# Patient Record
Sex: Female | Born: 1965 | Race: White | Hispanic: No | Marital: Married | Smoking: Former smoker
Health system: Southern US, Community
[De-identification: ages and names within clinical notes are randomized; demographics above are authoritative.]

## PROBLEM LIST (undated history)

## (undated) DIAGNOSIS — G43009 Migraine without aura, not intractable, without status migrainosus: Secondary | ICD-10-CM

## (undated) DIAGNOSIS — R740 Nonspecific elevation of levels of transaminase and lactic acid dehydrogenase [LDH]: Secondary | ICD-10-CM

## (undated) DIAGNOSIS — K219 Gastro-esophageal reflux disease without esophagitis: Secondary | ICD-10-CM

## (undated) DIAGNOSIS — M51369 Other intervertebral disc degeneration, lumbar region without mention of lumbar back pain or lower extremity pain: Secondary | ICD-10-CM

## (undated) DIAGNOSIS — E781 Pure hyperglyceridemia: Secondary | ICD-10-CM

## (undated) DIAGNOSIS — IMO0002 Reserved for concepts with insufficient information to code with codable children: Secondary | ICD-10-CM

## (undated) DIAGNOSIS — F419 Anxiety disorder, unspecified: Secondary | ICD-10-CM

## (undated) DIAGNOSIS — M5136 Other intervertebral disc degeneration, lumbar region: Secondary | ICD-10-CM

## (undated) HISTORY — DX: Nonspecific elevation of levels of transaminase and lactic acid dehydrogenase (ldh): R74.0

## (undated) HISTORY — PX: OTHER SURGICAL HISTORY: SHX169

## (undated) HISTORY — DX: Anxiety disorder, unspecified: F41.9

## (undated) HISTORY — DX: Other intervertebral disc degeneration, lumbar region: M51.36

## (undated) HISTORY — DX: Migraine without aura, not intractable, without status migrainosus: G43.009

## (undated) HISTORY — DX: Reserved for concepts with insufficient information to code with codable children: IMO0002

## (undated) HISTORY — DX: Pure hyperglyceridemia: E78.1

## (undated) HISTORY — DX: Other intervertebral disc degeneration, lumbar region without mention of lumbar back pain or lower extremity pain: M51.369

## (undated) HISTORY — DX: Gastro-esophageal reflux disease without esophagitis: K21.9

## (undated) HISTORY — PX: TONSILLECTOMY: SUR1361

---

## 1998-01-15 ENCOUNTER — Emergency Department (HOSPITAL_COMMUNITY): Admission: EM | Admit: 1998-01-15 | Discharge: 1998-01-15 | Payer: Self-pay | Admitting: Internal Medicine

## 1998-02-14 ENCOUNTER — Other Ambulatory Visit: Admission: RE | Admit: 1998-02-14 | Discharge: 1998-02-14 | Payer: Self-pay | Admitting: Obstetrics and Gynecology

## 1999-04-18 ENCOUNTER — Other Ambulatory Visit: Admission: RE | Admit: 1999-04-18 | Discharge: 1999-04-18 | Payer: Self-pay | Admitting: *Deleted

## 2000-06-30 ENCOUNTER — Encounter: Payer: Self-pay | Admitting: Internal Medicine

## 2000-06-30 ENCOUNTER — Ambulatory Visit (HOSPITAL_COMMUNITY): Admission: RE | Admit: 2000-06-30 | Discharge: 2000-06-30 | Payer: Self-pay | Admitting: Internal Medicine

## 2000-08-26 ENCOUNTER — Emergency Department (HOSPITAL_COMMUNITY): Admission: EM | Admit: 2000-08-26 | Discharge: 2000-08-27 | Payer: Self-pay | Admitting: Internal Medicine

## 2000-08-28 ENCOUNTER — Ambulatory Visit (HOSPITAL_COMMUNITY): Admission: RE | Admit: 2000-08-28 | Discharge: 2000-08-28 | Payer: Self-pay | Admitting: Emergency Medicine

## 2000-08-28 ENCOUNTER — Encounter: Payer: Self-pay | Admitting: Emergency Medicine

## 2001-04-28 ENCOUNTER — Other Ambulatory Visit: Admission: RE | Admit: 2001-04-28 | Discharge: 2001-04-28 | Payer: Self-pay | Admitting: Obstetrics and Gynecology

## 2001-05-19 ENCOUNTER — Other Ambulatory Visit: Admission: RE | Admit: 2001-05-19 | Discharge: 2001-05-19 | Payer: Self-pay | Admitting: Obstetrics and Gynecology

## 2001-12-10 ENCOUNTER — Encounter: Admission: RE | Admit: 2001-12-10 | Discharge: 2001-12-10 | Payer: Self-pay | Admitting: Obstetrics and Gynecology

## 2001-12-10 ENCOUNTER — Encounter: Payer: Self-pay | Admitting: Obstetrics and Gynecology

## 2002-05-02 ENCOUNTER — Other Ambulatory Visit: Admission: RE | Admit: 2002-05-02 | Discharge: 2002-05-02 | Payer: Self-pay | Admitting: Obstetrics and Gynecology

## 2002-06-03 ENCOUNTER — Encounter: Admission: RE | Admit: 2002-06-03 | Discharge: 2002-06-03 | Payer: Self-pay | Admitting: Obstetrics and Gynecology

## 2002-06-03 ENCOUNTER — Encounter: Payer: Self-pay | Admitting: Obstetrics and Gynecology

## 2003-11-29 ENCOUNTER — Encounter: Admission: RE | Admit: 2003-11-29 | Discharge: 2003-11-29 | Payer: Self-pay | Admitting: Family Medicine

## 2004-12-10 ENCOUNTER — Ambulatory Visit: Payer: Self-pay | Admitting: Internal Medicine

## 2005-01-30 ENCOUNTER — Ambulatory Visit: Payer: Self-pay | Admitting: Internal Medicine

## 2005-10-10 ENCOUNTER — Ambulatory Visit: Payer: Self-pay | Admitting: Internal Medicine

## 2006-02-18 ENCOUNTER — Ambulatory Visit: Payer: Self-pay | Admitting: Internal Medicine

## 2006-04-17 ENCOUNTER — Ambulatory Visit: Payer: Self-pay | Admitting: Internal Medicine

## 2006-04-23 ENCOUNTER — Encounter: Admission: RE | Admit: 2006-04-23 | Discharge: 2006-04-23 | Payer: Self-pay | Admitting: Internal Medicine

## 2006-09-10 ENCOUNTER — Encounter: Admission: RE | Admit: 2006-09-10 | Discharge: 2006-09-10 | Payer: Self-pay | Admitting: Obstetrics and Gynecology

## 2007-04-16 ENCOUNTER — Telehealth (INDEPENDENT_AMBULATORY_CARE_PROVIDER_SITE_OTHER): Payer: Self-pay | Admitting: *Deleted

## 2007-05-14 ENCOUNTER — Telehealth (INDEPENDENT_AMBULATORY_CARE_PROVIDER_SITE_OTHER): Payer: Self-pay | Admitting: *Deleted

## 2007-05-28 ENCOUNTER — Encounter: Payer: Self-pay | Admitting: Internal Medicine

## 2007-06-23 ENCOUNTER — Encounter: Payer: Self-pay | Admitting: Internal Medicine

## 2007-07-15 ENCOUNTER — Telehealth (INDEPENDENT_AMBULATORY_CARE_PROVIDER_SITE_OTHER): Payer: Self-pay | Admitting: *Deleted

## 2007-07-23 ENCOUNTER — Ambulatory Visit: Payer: Self-pay | Admitting: Internal Medicine

## 2007-07-23 DIAGNOSIS — G43009 Migraine without aura, not intractable, without status migrainosus: Secondary | ICD-10-CM

## 2007-07-23 DIAGNOSIS — M545 Low back pain, unspecified: Secondary | ICD-10-CM | POA: Insufficient documentation

## 2007-07-23 DIAGNOSIS — M5412 Radiculopathy, cervical region: Secondary | ICD-10-CM | POA: Insufficient documentation

## 2007-07-26 ENCOUNTER — Encounter (INDEPENDENT_AMBULATORY_CARE_PROVIDER_SITE_OTHER): Payer: Self-pay | Admitting: *Deleted

## 2007-07-27 ENCOUNTER — Encounter (INDEPENDENT_AMBULATORY_CARE_PROVIDER_SITE_OTHER): Payer: Self-pay | Admitting: *Deleted

## 2007-07-27 ENCOUNTER — Ambulatory Visit: Payer: Self-pay | Admitting: Internal Medicine

## 2007-11-04 ENCOUNTER — Encounter: Admission: RE | Admit: 2007-11-04 | Discharge: 2007-11-04 | Payer: Self-pay | Admitting: Obstetrics and Gynecology

## 2007-12-20 ENCOUNTER — Encounter: Payer: Self-pay | Admitting: Internal Medicine

## 2008-01-26 ENCOUNTER — Encounter: Payer: Self-pay | Admitting: Internal Medicine

## 2008-08-14 ENCOUNTER — Encounter: Payer: Self-pay | Admitting: Internal Medicine

## 2008-12-07 ENCOUNTER — Encounter: Payer: Self-pay | Admitting: Internal Medicine

## 2008-12-18 ENCOUNTER — Encounter (INDEPENDENT_AMBULATORY_CARE_PROVIDER_SITE_OTHER): Payer: Self-pay | Admitting: *Deleted

## 2008-12-20 ENCOUNTER — Encounter: Payer: Self-pay | Admitting: Internal Medicine

## 2008-12-28 ENCOUNTER — Ambulatory Visit: Payer: Self-pay | Admitting: Internal Medicine

## 2008-12-28 DIAGNOSIS — K219 Gastro-esophageal reflux disease without esophagitis: Secondary | ICD-10-CM | POA: Insufficient documentation

## 2008-12-28 DIAGNOSIS — F411 Generalized anxiety disorder: Secondary | ICD-10-CM | POA: Insufficient documentation

## 2008-12-29 ENCOUNTER — Encounter: Payer: Self-pay | Admitting: Internal Medicine

## 2009-01-19 ENCOUNTER — Telehealth (INDEPENDENT_AMBULATORY_CARE_PROVIDER_SITE_OTHER): Payer: Self-pay | Admitting: *Deleted

## 2009-03-16 ENCOUNTER — Telehealth (INDEPENDENT_AMBULATORY_CARE_PROVIDER_SITE_OTHER): Payer: Self-pay | Admitting: *Deleted

## 2009-03-23 ENCOUNTER — Encounter: Payer: Self-pay | Admitting: Internal Medicine

## 2009-08-11 DIAGNOSIS — R7401 Elevation of levels of liver transaminase levels: Secondary | ICD-10-CM

## 2009-08-11 DIAGNOSIS — E781 Pure hyperglyceridemia: Secondary | ICD-10-CM

## 2009-08-11 HISTORY — DX: Pure hyperglyceridemia: E78.1

## 2009-08-11 HISTORY — DX: Elevation of levels of liver transaminase levels: R74.01

## 2009-08-11 HISTORY — PX: ENDOSCOPIC PLANTAR FASCIOTOMY: SUR443

## 2009-08-23 ENCOUNTER — Encounter: Payer: Self-pay | Admitting: Internal Medicine

## 2009-10-04 ENCOUNTER — Telehealth (INDEPENDENT_AMBULATORY_CARE_PROVIDER_SITE_OTHER): Payer: Self-pay | Admitting: *Deleted

## 2009-10-26 ENCOUNTER — Ambulatory Visit: Payer: Self-pay | Admitting: Internal Medicine

## 2010-03-08 ENCOUNTER — Encounter: Payer: Self-pay | Admitting: Internal Medicine

## 2010-06-21 ENCOUNTER — Encounter: Payer: Self-pay | Admitting: Internal Medicine

## 2010-07-01 ENCOUNTER — Ambulatory Visit: Payer: Self-pay | Admitting: Family Medicine

## 2010-07-01 DIAGNOSIS — M25569 Pain in unspecified knee: Secondary | ICD-10-CM

## 2010-07-24 ENCOUNTER — Encounter: Payer: Self-pay | Admitting: Internal Medicine

## 2010-07-24 ENCOUNTER — Ambulatory Visit: Payer: Self-pay | Admitting: Family Medicine

## 2010-07-25 ENCOUNTER — Encounter: Payer: Self-pay | Admitting: Internal Medicine

## 2010-08-07 ENCOUNTER — Encounter: Payer: Self-pay | Admitting: Internal Medicine

## 2010-08-11 HISTORY — PX: MENISCUS REPAIR: SHX5179

## 2010-09-08 LAB — CONVERTED CEMR LAB
ALT: 48 units/L — ABNORMAL HIGH (ref 0–35)
ALT: 61 units/L — ABNORMAL HIGH (ref 0–35)
AST: 35 units/L (ref 0–37)
Albumin: 4.5 g/dL (ref 3.5–5.2)
Alkaline Phosphatase: 57 units/L (ref 39–117)
BUN: 12 mg/dL (ref 6–23)
BUN: 14 mg/dL (ref 6–23)
Basophils Absolute: 0 10*3/uL (ref 0.0–0.1)
Basophils Relative: 0.1 % (ref 0.0–1.0)
Basophils Relative: 0.4 % (ref 0.0–3.0)
Bilirubin, Direct: 0.1 mg/dL (ref 0.0–0.3)
CO2: 28 meq/L (ref 19–32)
Calcium: 9.4 mg/dL (ref 8.4–10.5)
Chloride: 107 meq/L (ref 96–112)
Chloride: 108 meq/L (ref 96–112)
Cholesterol: 171 mg/dL (ref 0–200)
Creatinine, Ser: 0.8 mg/dL (ref 0.4–1.2)
Direct LDL: 121.7 mg/dL
Eosinophils Absolute: 0 10*3/uL (ref 0.0–0.6)
Eosinophils Relative: 0.9 % (ref 0.0–5.0)
Eosinophils Relative: 1.1 % (ref 0.0–5.0)
GFR calc Af Amer: 102 mL/min
GFR calc non Af Amer: 84 mL/min
Glucose, Bld: 85 mg/dL (ref 70–99)
HCT: 38.4 % (ref 36.0–46.0)
HCT: 38.6 % (ref 36.0–46.0)
HDL: 46.4 mg/dL (ref >39.0)
Hemoglobin: 13.3 g/dL (ref 12.0–15.0)
LDL Cholesterol: 95 mg/dL (ref 0–99)
Lymphocytes Relative: 32.3 % (ref 12.0–46.0)
Lymphs Abs: 1.4 10*3/uL (ref 0.7–4.0)
MCHC: 34.6 g/dL (ref 30.0–36.0)
MCV: 93.7 fL (ref 78.0–100.0)
MCV: 94.8 fL (ref 78.0–100.0)
Monocytes Absolute: 0.3 10*3/uL (ref 0.2–0.7)
Monocytes Absolute: 0.4 10*3/uL (ref 0.1–1.0)
Monocytes Relative: 5 % (ref 3.0–11.0)
Neutro Abs: 3.3 10*3/uL (ref 1.4–7.7)
Neutrophils Relative %: 61.7 % (ref 43.0–77.0)
Platelets: 182 10*3/uL (ref 150–400)
Platelets: 197 10*3/uL (ref 150.0–400.0)
Potassium: 3.4 meq/L — ABNORMAL LOW (ref 3.5–5.1)
Potassium: 4 meq/L (ref 3.5–5.1)
RBC: 4.05 M/uL (ref 3.87–5.11)
RBC: 4.12 M/uL (ref 3.87–5.11)
RDW: 11.5 % (ref 11.5–14.6)
Sodium: 145 meq/L (ref 135–145)
TSH: 2.5 microintl units/mL (ref 0.35–5.50)
TSH: 4.69 microintl units/mL (ref 0.35–5.50)
Total Bilirubin: 0.8 mg/dL (ref 0.3–1.2)
Total CHOL/HDL Ratio: 3.7
Total Protein: 7.5 g/dL (ref 6.0–8.3)
Total Protein: 8.2 g/dL (ref 6.0–8.3)
Triglycerides: 149 mg/dL (ref 0–149)
VLDL: 30 mg/dL (ref 0–40)
VLDL: 35.2 mg/dL (ref 0.0–40.0)
WBC: 5.3 10*3/uL (ref 4.5–10.5)
WBC: 5.3 10*3/uL (ref 4.5–10.5)

## 2010-09-12 NOTE — Letter (Signed)
Summary: Cancer Screening/[facility] Personalized Risk Profile  Cancer Screening/[facility] Personalized Risk Profile   Imported By: Lanelle Bal 10/30/2009 11:02:53  _____________________________________________________________________  External Attachment:    Type:   Image     Comment:   External Document

## 2010-09-12 NOTE — Consult Note (Signed)
Summary: [facility]  [facility]   Imported By: Lanelle Bal 08/07/2010 09:11:33  _____________________________________________________________________  External Attachment:    Type:   Image     Comment:   External Document

## 2010-09-12 NOTE — Letter (Signed)
Summary: ID Card for Patient Body to be Donated/[facility]  ID Card for Patient Body to be Donated/[facility]   Imported By: Lanelle Bal 03/18/2010 13:27:47  _____________________________________________________________________  External Attachment:    Type:   Image     Comment:   External Document

## 2010-09-12 NOTE — Letter (Signed)
Summary: [facility]  [facility]   Imported By: Lanelle Bal 09/10/2009 11:39:57  _____________________________________________________________________  External Attachment:    Type:   Image     Comment:   External Document

## 2010-09-12 NOTE — Consult Note (Signed)
Summary: [facility]   Imported By: Lanelle Bal 08/19/2010 09:43:52  _____________________________________________________________________  External Attachment:    Type:   Image     Comment:   External Document

## 2010-09-12 NOTE — Assessment & Plan Note (Signed)
Summary: CPX/NS/KDC   Vital Signs:  Patient profile:   45 year old female Height:      64 inches Weight:      140 pounds BMI:     24.12 Temp:     98.1 degrees F oral Pulse rate:   102 / minute Resp:     14 per minute BP sitting:   131 / 93  (left arm) Cuff size:   large  Vitals Entered By: Shonna Chock (October 26, 2009 8:31 AM)  Comments REVIEWED MED LIST, PATIENT AGREED DOSE AND INSTRUCTION CORRECT    History of Present Illness: Nicole Dunn is here for a physical; she had OP fasciotomy last week & has residual pain(see BP).She has FH but no personal PMH of HTN. BP averages 120/60.Dr Freida BusmanEduard Clos 's 08/2009 OV reviewed : intermittent cervical radiculopathy C6-8 . Symptoms have been bilateral in distribution. Also L5/S1 symptoms essentially controlled with Gabapentin.  Preventive Screening-Counseling & Management  Caffeine-Diet-Exercise     Does Patient Exercise: no  Allergies: 1)  ! Lexapro 2)  ! 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 iatrogenic brother: pvd grandfather: clotting problem, PTE family history of bipolar, depression  Social History: Former Smoker ,quit 12/1998 No diet Alcohol use-yes rarely Occupation:Registered Resp Therapist Regular exercise-no  Review of Systems General:  Denies fatigue, sleep disorder, and weight loss. Eyes:  Denies blurring, double vision, and vision loss-both eyes. ENT:  Complains of ringing in ears; denies decreased hearing, nasal  congestion, and sinus pressure; Tinnitus chronically. CV:  Denies chest pain or discomfort, leg cramps with exertion, palpitations, shortness of breath with exertion, swelling of feet, and swelling of hands. Resp:  Denies cough, shortness of breath, and sputum productive. GI:  Denies abdominal pain, bloody stools, dark tarry stools, diarrhea, and indigestion; Intermittent constipation , ? med related.ERD controlled with Wellbutrin. GU:  Denies discharge, dysuria, and hematuria; Debbora Dus , NP seen 2010. MS:  Complains of low back pain; denies mid back pain, muscle weakness, and thoracic pain; See PMH; gait affects gait with L5/S1 area pain. Derm:  Denies changes in nail beds, dryness, hair loss, and lesion(s). Neuro:  Denies brief paralysis, headaches, numbness, tingling, and weakness. Psych:  Denies anxiety, depression, easily angered, easily tearful, and irritability; Some PMS; stable on Wellbutrin. Endo:  Denies cold intolerance, excessive hunger, excessive thirst, excessive urination, and heat intolerance. Heme:  Denies abnormal bruising and bleeding. Allergy:  Complains of itching eyes and sneezing; denies hives or rash; Pruritis anxiety induced.  Physical Exam  General:  Thin,well-nourished; alert,appropriate and cooperative throughout examination Head:  Normocephalic and atraumatic without obvious abnormalities. Hair very fine Eyes:  No corneal or conjunctival inflammation noted.Perrla. Funduscopic exam benign, without hemorrhages, exudates or papilledema.  Ears:  External ear exam shows no significant lesions or deformities.  Otoscopic examination reveals clear canals, tympanic membranes are intact bilaterally without bulging, retraction, inflammation or discharge. Hearing is grossly normal bilaterally. Nose:  External nasal examination shows no deformity or inflammation. Nasal mucosa are pink and moist without lesions or exudates. Mouth:  Oral mucosa  and oropharynx without lesions or  exudates.  Teeth in good repair. Neck:  No deformities, masses, or tenderness noted. ? L cervical rib Lungs:  Normal respiratory effort, chest expands symmetrically. Lungs are clear to auscultation, no crackles or wheezes. Heart:  Normal rate and regular rhythm. S1 and S2 normal without gallop, murmur, click, rub. S4 with slurring Abdomen:  Bowel sounds positive,abdomen soft and non-tender without masses, organomegaly or hernias noted. Aorta palpable w/o AAA Genitalia:  Debbora Dus ,NP Msk:  No deformity or scoliosis noted of thoracic or lumbar spine.   Pulses:  R and L carotid,radial,femoral,dorsalis pedis and posterior tibial pulses are full and equal bilaterally Extremities:  No clubbing, cyanosis, edema, or deformity noted with normal full range of motion of all joints. L foot in walking boot  Neurologic:  alert & oriented X3, strength normal in all extremities, and DTRs symmetrical and normal.   Skin:  Intact without suspicious lesions or rashes Cervical Nodes:  No lymphadenopathy noted Axillary Nodes:  No palpable lymphadenopathy Psych:  memory intact for recent and remote, normally interactive, and good eye contact.     Impression & Recommendations:  Problem # 1:  ROUTINE GENERAL MEDICAL EXAM@HEALTH  CARE FACL (ICD-V70.0)  Orders: EKG w/ Interpretation (93000) Venipuncture (54098) TLB-Lipid Panel (80061-LIPID) TLB-BMP (Basic Metabolic Panel-BMET) (80048-METABOL) TLB-CBC Platelet - w/Differential (85025-CBCD) TLB-Hepatic/Liver Function Pnl (80076-HEPATIC) TLB-TSH (Thyroid Stimulating Hormone) (84443-TSH)  Problem # 2:  LUMBAR RADICULOPATHY, LEFT (ICD-724.4)  Her updated medication list for this problem includes:    Vicodin Es 7.5-750 Mg Tabs (Hydrocodone-acetaminophen) .Marland Kitchen... 1 by mouth qd    Flexeril 5 Mg Tabs (Cyclobenzaprine hcl) .Marland Kitchen... 1 by mouth as needed  Problem # 3:  CERVICAL RADICULOPATHY (ICD-723.4) both RUE & LUE  Problem # 4:  GERD  (ICD-530.81)  controlled  Orders: Venipuncture (11914)  Complete Medication List: 1)  Neurontin 300 Mg Caps (Gabapentin) .Marland Kitchen.. 1 by mouth qam, 1 by mouth noon, and 3 tabs qhs 2)  Vicodin Es 7.5-750 Mg Tabs (Hydrocodone-acetaminophen) .Marland Kitchen.. 1 by mouth qd 3)  Bupropion Hcl 150 Mg Xr24h-tab (Bupropion hcl) .... Take 1 tab once daily 4)  Flexeril 5 Mg Tabs (Cyclobenzaprine hcl) .Marland Kitchen.. 1 by mouth as needed  Patient Instructions: 1)  Check your Blood Pressure regularly. If it is above: 135/85 ON AVERAGE with pain control  you should  call. Prescriptions: BUPROPION HCL 150 MG XR24H-TAB (BUPROPION HCL) take 1 tab once daily  #90 x 3   Entered and Authorized by:   Marga Melnick MD   Signed by:   Marga Melnick MD on 10/26/2009   Method used:   Printed then faxed to ...       [facility]* (mail-order)             ,          Ph: [phone]       Fax: [phone]   RxID:   [id number]

## 2010-09-12 NOTE — Letter (Signed)
Summary[facility]oraha Dunn IM @ [name]   Imported By: Lanelle Bal 07/01/2010 15:19:36  _____________________________________________________________________  External Attachment:    Type:   Image     Comment:   External Document

## 2010-09-12 NOTE — Letter (Signed)
SummaryDeboraha Dunn IM @ [name] [place]   Imported By: Lanelle Bal 08/07/2010 11:45:42  _____________________________________________________________________  External Attachment:    Type:   Image     Comment:   External Document

## 2010-09-12 NOTE — Progress Notes (Signed)
Summary: Refill Request  Phone Note Refill Request Message from:  Patient  Refills Requested: Medication #1:  BUPROPION HCL 150 MG XR24H-TAB take 1 tab once daily. [employer]   Method Requested: Fax to [facility] Initial call taken by: Shonna Chock,  October 04, 2009 8:32 AM  Follow-up for Phone Call        Left message on VM informing patient rx was called in Follow-up by: Shonna Chock,  October 04, 2009 8:33 AM    Prescriptions: BUPROPION HCL 150 MG XR24H-TAB (BUPROPION HCL) take 1 tab once daily  #90 x 0   Entered by:   Shonna Chock   Authorized by:   Marga Melnick MD   Signed by:   Shonna Chock on 10/04/2009   Method used:   Electronically to        [facility]. [phone]* (retail)       [address]       [place], [place]  [address]       Ph: [phone]       Fax: [phone]   RxID:   [id number]

## 2010-09-12 NOTE — Assessment & Plan Note (Signed)
Summary: LEFT KNEE PAIN X6 DAYS/KN   Vital Signs:  Patient profile:   45 year old female Weight:      140 pounds Pulse rate:   98 / minute BP sitting:   114 / 70  (left arm)  Vitals Entered By: Doristine Devoid CMA (July 01, 2010 2:04 PM) CC: L knee pain constant x1 wk    History of Present Illness: 45 yo woman here today for L knee pain.  hx of plantar fasciitis on L.  was wearing boot last week and had to remove boot due to knee pain.  pain can be localized to medial joint space below the femoral condyle.  once pain hits will generalize to whole knee.  pain is a throbbing.  keeping pt awake.  no relief w/ heat or ice.  some relief w/ vicodin.  no relief w/ skelaxin.  no change or increase in activity.  no change in footwear.  Current Medications (verified): 1)  Lyrica 100 Mg Caps (Pregabalin) .... Take One Tablet in Am and 1 1/2 Tablet in Pm 2)  Vicodin Es 7.5-750 Mg  Tabs (Hydrocodone-Acetaminophen) .Marland Kitchen.. 1 By Mouth Qd 3)  Bupropion Hcl 150 Mg Xr24h-Tab (Bupropion Hcl) .... Take 1 Tab Once Daily 4)  Flexeril 5 Mg Tabs (Cyclobenzaprine Hcl) .Marland Kitchen.. 1 By Mouth As Needed  Allergies (verified): 1)  ! Lexapro 2)  ! * Topamax  Review of Systems      See HPI  Physical Exam  General:  Thin,well-nourished; alert,appropriate and cooperative throughout examination Msk:  + medial joint line tenderness of L knee.  pain at extremes of movement but little pain during midrange movements. Pulses:  +2 DP/PT Extremities:  no C/C/E   Impression & Recommendations:  Problem # 1:  KNEE PAIN (ICD-719.46) Assessment New concern for MCL or meniscus injury given pain over medial joint line.  pt admits that plantar fasciitis boot alters mechanics.  start steriods as pt reports she is intolerant to NSAIDs but does ok on prednisone.  refer to ortho. Her updated medication list for this problem includes:    Vicodin Es 7.5-750 Mg Tabs (Hydrocodone-acetaminophen) .Marland Kitchen... 1 by mouth qd    Skelaxin 800 Mg  Tabs (Metaxalone) .Marland Kitchen... Take as directed  Orders: Orthopedic Referral (Ortho)  Complete Medication List: 1)  Lyrica 100 Mg Caps (Pregabalin) .... Take one tablet in am and 1 1/2 tablet in pm 2)  Vicodin Es 7.5-750 Mg Tabs (Hydrocodone-acetaminophen) .Marland Kitchen.. 1 by mouth qd 3)  Bupropion Hcl 150 Mg Xr24h-tab (Bupropion hcl) .... Take 1 tab once daily 4)  Skelaxin 800 Mg Tabs (Metaxalone) .... Take as directed 5)  Prednisone 20 Mg Tabs (Prednisone) .... 2 tabs by mouth daily x7 days.  take w/ food.  Patient Instructions: 1)  We will call you with your ortho appt 2)  Ice or heat your knee (whichever feels better) 3)  Take the Prednisone as directed- take w/ food to avoid upset stomach 4)  Do not wear your boot until you have your knee evaluated 5)  Hang in there!!! Prescriptions: PREDNISONE 20 MG TABS (PREDNISONE) 2 tabs by mouth daily x7 days.  take w/ food.  #14 x 0   Entered and Authorized by:   Neena Rhymes MD   Signed by:   Neena Rhymes MD on 07/01/2010   Method used:   Electronically to        [facility]. #[id number]* (retail)       [address]  [place], [place]  [address]       Ph: [phone]       Fax: [phone]   RxID:   [id number]    Orders Added: 1)  Orthopedic Referral [Ortho] 2)  Est. Patient Level III [84132]

## 2010-09-12 NOTE — Assessment & Plan Note (Signed)
Summary: LEFT KNEE PAIN//PH   Vital Signs:  Patient profile:   45 year old female Weight:      138 pounds BMI:     23.77 Pulse rate:   76 / minute BP sitting:   114 / 78  (right arm)  Vitals Entered By: Doristine Devoid CMA (July 24, 2010 11:18 AM) CC: L knee pain got better w/ meds then started up again    History of Present Illness: 45 yo woman here today for persistant L knee pain.  initially resolved w/ Prednisone.  Sunday was having 'twinges'.  yesterday at work was in a lot of pain and had to take Vicodin for relief.  is icing knee regularly.  pain worsens w/ movement.  had to take off work today due to pain.  still unable to walk normally on foot after fasciotomy for plantar fasciitis.  Current Medications (verified): 1)  Lyrica 100 Mg Caps (Pregabalin) .... Take One Tablet in Am and 1 1/2 Tablet in Pm 2)  Vicodin Es 7.5-750 Mg  Tabs (Hydrocodone-Acetaminophen) .Marland Kitchen.. 1 By Mouth Qd 3)  Bupropion Hcl 150 Mg Xr24h-Tab (Bupropion Hcl) .... Take 1 Tab Once Daily 4)  Skelaxin 800 Mg Tabs (Metaxalone) .... Take As Directed 5)  Prednisone 20 Mg Tabs (Prednisone) .... 2 Tabs By Mouth Daily X7 Days.  Take W/ Food.  Allergies (verified): 1)  ! Lexapro 2)  ! * Topamax  Review of Systems      See HPI  Physical Exam  General:  Thin,well-nourished; alert,appropriate and cooperative throughout examination Msk:  + medial joint line tenderness of L knee.  pain at extremes of movement but little pain during midrange movements. Pulses:  +2 DP/PT Extremities:  no C/C/E   Impression & Recommendations:  Problem # 1:  KNEE PAIN (ICD-719.46) Assessment Unchanged pt has ortho appt this afternoon.  offered toradol injxn but pt declined.  likely due to altered mechanics w/ recent foot problems.  will defer to ortho for definitive evaluation and tx.  Pt expresses understanding and is in agreement w/ this plan. Her updated medication list for this problem includes:    Vicodin Es 7.5-750 Mg  Tabs (Hydrocodone-acetaminophen) .Marland Kitchen... 1 by mouth qd    Skelaxin 800 Mg Tabs (Metaxalone) .Marland Kitchen... Take as directed  Complete Medication List: 1)  Lyrica 100 Mg Caps (Pregabalin) .... Take one tablet in am and 1 1/2 tablet in pm 2)  Vicodin Es 7.5-750 Mg Tabs (Hydrocodone-acetaminophen) .Marland Kitchen.. 1 by mouth qd 3)  Bupropion Hcl 150 Mg Xr24h-tab (Bupropion hcl) .... Take 1 tab once daily 4)  Skelaxin 800 Mg Tabs (Metaxalone) .... Take as directed  Patient Instructions: 1)  You have an appt at 1:30 at [facility] (in the same building as the [facility]) 2)  Continue to ice 3)  Take the Vicodin as needed 4)  Hang in there!!!   Orders Added: 1)  Est. Patient Level III [40981]

## 2010-10-03 ENCOUNTER — Ambulatory Visit: Payer: Self-pay

## 2010-10-05 ENCOUNTER — Encounter: Payer: Self-pay | Admitting: Internal Medicine

## 2010-10-17 ENCOUNTER — Encounter: Payer: Self-pay | Admitting: Internal Medicine

## 2010-10-18 ENCOUNTER — Encounter: Payer: Self-pay | Admitting: Internal Medicine

## 2010-10-29 NOTE — Letter (Signed)
Summary: [facility]  [facility]   Imported By: Maryln Gottron 10/24/2010 10:33:12  _____________________________________________________________________  External Attachment:    Type:   Image     Comment:   External Document

## 2010-11-19 ENCOUNTER — Encounter: Payer: Self-pay | Admitting: Internal Medicine

## 2010-12-21 ENCOUNTER — Encounter: Payer: Self-pay | Admitting: Internal Medicine

## 2010-12-23 ENCOUNTER — Ambulatory Visit (INDEPENDENT_AMBULATORY_CARE_PROVIDER_SITE_OTHER): Payer: BC Managed Care – PPO | Admitting: Internal Medicine

## 2010-12-23 ENCOUNTER — Encounter: Payer: Self-pay | Admitting: Internal Medicine

## 2010-12-23 VITALS — BP 136/86 | HR 84 | Wt 141.0 lb

## 2010-12-23 DIAGNOSIS — F411 Generalized anxiety disorder: Secondary | ICD-10-CM

## 2010-12-23 DIAGNOSIS — L509 Urticaria, unspecified: Secondary | ICD-10-CM

## 2010-12-23 DIAGNOSIS — R1013 Epigastric pain: Secondary | ICD-10-CM

## 2010-12-23 DIAGNOSIS — K3189 Other diseases of stomach and duodenum: Secondary | ICD-10-CM

## 2010-12-23 MED ORDER — BUPROPION HCL ER (XL) 150 MG PO TB24: 150.0000 mg | ORAL_TABLET | Freq: Every day | ORAL | Status: DC

## 2010-12-23 MED ORDER — RANITIDINE HCL 150 MG PO TABS: 150.0000 mg | ORAL_TABLET | Freq: Two times a day (BID) | ORAL | Status: AC

## 2010-12-23 MED ORDER — HYDROXYZINE PAMOATE 25 MG PO CAPS: 25.0000 mg | ORAL_CAPSULE | Freq: Three times a day (TID) | ORAL | Status: DC | PRN

## 2010-12-23 NOTE — Patient Instructions (Signed)
Please increase the generic Wellbutrin to 2 pills daily; assess response to this average dose of medication.

## 2010-12-23 NOTE — Progress Notes (Signed)
  Subjective:    Patient ID: Nicole Dunn, female    DOB: [birth year], 45 y.o.   MRN: [id number]  HPI  Anxiety Onset:exacerbation in 08/2010 in context of son's issues (DWI , totaled her car ) Depression:yes  intermittently Loss of interest (Anhedonia):yes Panic attacks:no Insomnia:difficulty going to sleep Anorexia:"OK" Fatigue:yes Neurologic signs/symptoms: Headache, numbness and tingling, weakness:only environmentally related headaches Endocrinologic signs and symptoms: Hoarseness, weight change, vision change, temperature intolerance, bowel changes, skin/hair,/nail changes: hair thinning Family history of mental health issues, alcoholism or drug abuse:very strong FH of mental health issues Medications/efficacy: Wellbutrin 150 has been effective in past       Review of Systems intermittent hives & heart burn with stress     Objective:   Physical Exam She is in no acute distress; intermittently she is appropriately tearful.  Extraocular motion exam is intact. Specifically she has no lid lag.  S4 is noted without significant murmur.  Thyroid reveals physiologic asymmetry with the right lobe bigger than the left. No nodules are palpable  She has no tremor or need for lysis of the nailbeds.  Deep tendon reflexes are normal.          Assessment & Plan:  #1 anxiety, situational. Response is mature and appropriate  #2 urticaria  #3 dyspepsia  Plan: #1 free T4, TSH  #2 increase the generic Wellbutrin to 300 mg daily.   #3 Add an H2 blocker and H1 blocker as needed.

## 2010-12-27 NOTE — Assessment & Plan Note (Signed)
[facility]                          [place] OFFICE NOTE   NAME:Dunn, Nicole D                          MRN:          [id number]  DATE:04/17/2006                            DOB:          28-May-1966    Nicole Dunn has been seen on two occasions, July 11 and April 17, 2006,  related to sciatic-type radicular pain.  It is described as a constant  toothache-type pain in the left buttock, extending into the left ankle and  calf.  She has used two pillows under her knees at night for relief.  There  is no history of injury, but the symptoms have been present intermittently  since her pregnancy 13 years ago.  Acupuncture has been of benefit.  She was  initially treated with prednisone, Flexeril, Arthritis-Strength Tylenol and  Ultram, but has continued to have symptoms.  The symptoms are almost daily  and can last hours.  Ultram has been of some benefit.   Additionally, she has had a headache from the occipital area with anterior  radiation.  She does have a history of migraines, for which she has used  Excedrin and caffeine-containing beverages.  She has no prodrome.  Aura  consists of black spots centrally or peripheral vision loss.  The headache  occurred April 13, 2006, and April 14, 2006, and was described as a  severe pain for half an hour and then a dull pain throughout the day.   Her neuro exam is unremarkable.  She has negative straight leg raising to 90  degrees.  There was minimum asymmetry of the nasolabial folds, but no  significant deficit was present.   Neurontin 100 mg every 8 hours was prescribed for the occipital headaches;  this also may help the sciatic pain.   She definitely feels there is a progression of the sciatic symptoms over the  last two years.   MRI performed April 23, 2006, at [facility], showed disk bulge  at L5-S1 that contacts the thecal sac and the S1 nerve root, but no definite  neural  compression was noted.  Neural irritation could not be ruled out.  There was a very minimal disk bulge at L4-L5 with no neural compression.   Salle is a very stoic individual; I feel neurosurgical evaluation of the  persistent and progressive symptoms would be appropriate; consultation will  be arranged.                                   Titus Dubin. Alwyn Ren, MD,FACP,FCCP   [facility]  DD:  04/29/2006  DT:  04/30/2006  Job #:  [id number]   cc:   Ms. Nicole Dunn

## 2011-01-16 ENCOUNTER — Encounter: Payer: Self-pay | Admitting: Internal Medicine

## 2011-01-16 ENCOUNTER — Ambulatory Visit (INDEPENDENT_AMBULATORY_CARE_PROVIDER_SITE_OTHER): Payer: BC Managed Care – PPO | Admitting: Internal Medicine

## 2011-01-16 VITALS — BP 118/66 | HR 76 | Temp 98.2°F | Resp 12 | Ht 64.0 in | Wt 138.8 lb

## 2011-01-16 DIAGNOSIS — IMO0002 Reserved for concepts with insufficient information to code with codable children: Secondary | ICD-10-CM

## 2011-01-16 DIAGNOSIS — K219 Gastro-esophageal reflux disease without esophagitis: Secondary | ICD-10-CM

## 2011-01-16 DIAGNOSIS — Z0000 Encounter for general adult medical examination without abnormal findings: Secondary | ICD-10-CM

## 2011-01-16 DIAGNOSIS — G43009 Migraine without aura, not intractable, without status migrainosus: Secondary | ICD-10-CM

## 2011-01-16 DIAGNOSIS — M5412 Radiculopathy, cervical region: Secondary | ICD-10-CM

## 2011-01-16 LAB — BASIC METABOLIC PANEL
BUN: 19 mg/dL (ref 6–23)
Calcium: 8.9 mg/dL (ref 8.4–10.5)
GFR: 72.09 mL/min (ref >60.00)
Glucose, Bld: 88 mg/dL (ref 70–99)

## 2011-01-16 LAB — LIPID PANEL
Cholesterol: 165 mg/dL (ref 0–200)
VLDL: 14 mg/dL (ref 0.0–40.0)

## 2011-01-16 LAB — CBC WITH DIFFERENTIAL/PLATELET
Basophils Absolute: 0 10*3/uL (ref 0.0–0.1)
HCT: 36 % (ref 36.0–46.0)
Lymphocytes Relative: 23.3 % (ref 12.0–46.0)
Lymphs Abs: 1.1 10*3/uL (ref 0.7–4.0)
Monocytes Relative: 6 % (ref 3.0–12.0)
Platelets: 204 10*3/uL (ref 150.0–400.0)
RDW: 12.4 % (ref 11.5–14.6)

## 2011-01-16 LAB — HEPATIC FUNCTION PANEL
ALT: 15 U/L (ref 0–35)
AST: 16 U/L (ref 0–37)
Bilirubin, Direct: 0.1 mg/dL (ref 0.0–0.3)
Total Protein: 7.3 g/dL (ref 6.0–8.3)

## 2011-01-16 MED ORDER — RIZATRIPTAN BENZOATE 10 MG PO TBDP: 10.0000 mg | ORAL_TABLET | ORAL | Status: DC | PRN

## 2011-01-16 NOTE — Assessment & Plan Note (Signed)
Plan: tryptan trial

## 2011-01-16 NOTE — Assessment & Plan Note (Signed)
Plan: ranitidine every 12 hrs prn & trigger avoidance

## 2011-01-16 NOTE — Patient Instructions (Signed)
Preventive Health Care: Exercise  30-45  minutes a day, 3-4 days a week. Walking is especially valuable in preventing Osteoporosis. Eat a low-fat diet with lots of fruits and vegetables, up to 7-9 servings per day.   The most common cause of elevated triglycerides is the ingestion of sugar from high fructose corn syrup sources. You should consume less than 40 grams  of sugar per day from foods and drinks with high fructose corn syrup as number 2, 3, or #4 on the label. As TG go up, HDL or good cholesterol goes down. Also uric acid which causes gout will go up.  Depression is common in our stressful world. If you're feeling down or losing interest in things you normally enjoy, please call. Violence - If anyone is threatening or hurting you, please call immediately.  Please keep a diary of your headaches . Document  each occurrence on the calendar with notation of : #1 any prodrome ( any non headache symptom such as marked fatigue,visual changes, ,etc ) which precedes actual headache ; #2) severity on 1-10 scale; #3) any triggers ( food/ drink,enviromenntal or weather changes ,physical or emotional stress) in 8-12 hour period prior to the headache; & #4) response to any medications or other intervention. Please review "Headache" @ [facility] for additional information.

## 2011-01-16 NOTE — Assessment & Plan Note (Signed)
As per Dr Nickola Major; [facility]

## 2011-01-16 NOTE — Assessment & Plan Note (Signed)
Plan: Home PT

## 2011-01-16 NOTE — Progress Notes (Signed)
  Subjective:    Patient ID: Nicole Dunn, female    DOB: [birth year], 45 y.o.   MRN: [id number]  HPI Takeyla is here for a physical; she is asymptomatic    Review of Systems Patient reports no vision/ hearing  changes, adenopathy,fever, weight change,  persistant / recurrent hoarseness , swallowing issues, chest pain,palpitations,edema,persistant /recurrent cough, hemoptysis, dyspnea( rest/ exertional/paroxysmal nocturnal), gastrointestinal bleeding(melena, rectal bleeding), abdominal pain, significant heartburn,  bowel changes,GU symptoms(dysuria, hematuria,pyuria, incontinence) ), Gyn symptoms(abnormal  bleeding , pain),  syncope, focal weakness, memory loss,numbness & tingling, skin/hair /nail changes,abnormal bruising or bleeding, or clotting issues.  Chronic tinnitus. Recent UTI ,resolution with antibiotics     Objective:   Physical Exam Gen.: Thin but ealthy and well-nouhrished in appearance. Alert, appropriate and cooperative throughout exam. Head: Normocephalic without obvious abnormalities.Hair fine Eyes: No corneal or conjunctival inflammation noted. Pupils equal round reactive to light and accommodation. Fundal exam is benign without hemorrhages, exudate, papilledema. Extraocular motion intact. Vision grossly normal. Ears: External  ear exam reveals no significant lesions or deformities. Canals clear .TMs normal. Hearing is grossly normal bilaterally. Nose: External nasal exam reveals no deformity or inflammation. Nasal mucosa are pink and moist. No lesions or exudates noted. Septum   Not deviated  Mouth: Oral mucosa and oropharynx reveal no lesions or exudates. Teeth in good repair. Neck: No deformities, masses, or tenderness noted. Range of motion &. Thyroid normal. ? Cervical rib on L Lungs: Normal respiratory effort; chest expands symmetrically. Lungs are clear to auscultation without rales, wheezes, or increased work of breathing.  Heart: Normal rate and rhythm. Normal S1 and S2. No  gallop, click, or rub. No murmur. Abdomen: Bowel sounds normal; abdomen soft and nontender. No masses, organomegaly or hernias noted. Genitalia: Dr Rosalio Macadamia                                                                                      Musculoskeletal/extremities: No deformity or scoliosis noted of  the thoracic or lumbar spine. No clubbing, cyanosis, edema, or deformity noted. Range of motion  normal .Tone & strength  normal.Joints normal. Nail health  good. Vascular: Carotid, radial artery, dorsalis pedis and dorsalis posterior tibial pulses are full and equal. No bruits present. Neurologic: Alert and oriented x3. Deep tendon reflexes symmetrical and normal.          Skin: Intact without suspicious lesions or rashes. Lymph: No cervical, axillary, or inguinal lymphadenopathy present. Psych: Mood and affect are normal. Normally interactive                                                                                         Assessment & Plan:  #1 Wellness exam; no active issues #2  See Problem List with Assessment Roosvelt Harps

## 2011-01-16 NOTE — Assessment & Plan Note (Signed)
As per Dr Nickola Major

## 2011-01-24 ENCOUNTER — Telehealth: Payer: Self-pay

## 2011-01-24 NOTE — Telephone Encounter (Signed)
Spoke w/ pt informed that insurance will not cover maxalt and that she needs to try alternative medication says that migraines don't really bother her that much and not to worry about prior authorization but is ok w/ alternative med as recommended by Dr. Alwyn Ren to try Relpax. Pt says that she will call if she has another migraine.

## 2011-02-19 ENCOUNTER — Other Ambulatory Visit: Payer: Self-pay | Admitting: Internal Medicine

## 2011-02-19 MED ORDER — BUPROPION HCL ER (XL) 300 MG PO TB24: 300.0000 mg | ORAL_TABLET | Freq: Every day | ORAL | Status: DC

## 2011-02-19 NOTE — Telephone Encounter (Signed)
RX sent to pharmacy  

## 2011-02-24 ENCOUNTER — Other Ambulatory Visit: Payer: Self-pay | Admitting: Internal Medicine

## 2011-02-25 MED ORDER — HYDROXYZINE PAMOATE 25 MG PO CAPS: 25.0000 mg | ORAL_CAPSULE | Freq: Three times a day (TID) | ORAL | Status: DC | PRN

## 2011-02-25 NOTE — Telephone Encounter (Signed)
RX sent to pharmacy  

## 2011-03-25 ENCOUNTER — Ambulatory Visit: Payer: BC Managed Care – PPO | Attending: Gynecologic Oncology | Admitting: Gynecologic Oncology

## 2011-03-25 DIAGNOSIS — Z79899 Other long term (current) drug therapy: Secondary | ICD-10-CM | POA: Insufficient documentation

## 2011-03-25 DIAGNOSIS — D069 Carcinoma in situ of cervix, unspecified: Secondary | ICD-10-CM | POA: Insufficient documentation

## 2011-03-26 NOTE — Consult Note (Signed)
NAME:  MARQUASIA, SCHMIEDER                   ACCOUNT NO.:  [id number]  MEDICAL RECORD NO.:  [id number]  LOCATION:  GYN                          FACILITY:  [facility]  PHYSICIAN:  Mitch Arquette A. Duard Brady, MD    DATE OF BIRTH:  20-Jul-1966  DATE OF CONSULTATION:  03/25/2011 DATE OF DISCHARGE:                                CONSULTATION   REFERRING PHYSICIAN:  Lendon Colonel, MD  The patient is seen today in consultation at the request of Dr. Noland Fordyce.  HISTORY OF PRESENT ILLNESS:  Ms. Bonine is a 45 year old gravida 3, para 2 abortive 1, who went for her for routine GYN examination.  Her Pap smear on February 18, 2011, revealed atypical glandular cells.  She subsequently underwent colposcopy, cervical biopsies, ECC, and endometrial biopsy on March 17, 2011.  Endometrial biopsy revealed complex endometrial hyperplasia with atypia.  There was a small portion of probable endometrial polyp.  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  Cervical biopsy at 4 o'clock revealed endocervical adenocarcinoma in situ in a minute focus, and the ECC was negative for dysplasia or tumor.  It is for this reason that she comes in to see [place] today.  She states that this is her first abnormal Pap smear.  Her last Pap smear was in May 2011 and was negative.  Her cycles are regular.  Her last cycle was July 27th of the year 2012.  She has no perimenopausal symptoms.  She denies any change in bowel or bladder habits, any nausea, vomiting, fevers, chills, unintentional weight loss, weight gain, dyspareunia, postcoital bleeding.  REVIEW OF SYSTEMS:  For 10 points is as above.  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  There is no numbness.  It is primarily pain and burning in her bilateral arms and her left leg.  PAST MEDICAL HISTORY: 1. 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 Nickola Major. 2. Plantar fasciitis on the left. 3. Left knee  meniscal disease. 4. Degenerative joint disease.  PAST SURGICAL HISTORY: 1. Surgery on her left foot for plantar fasciitis. 2. Left knee meniscus surgery in March of this year.  MEDICATIONS: 1. Lyrica 100 mg in the morning and 150 mg in the evening. 2. Skelaxin 800 mg p.r.n. 3. Vicodin p.r.n. 4. Wellbutrin 300 mg daily. 5. Valtrex 500 mg daily. 6. Vitamin D.  ALLERGIES:  None.  SOCIAL HISTORY:  She denies use of tobacco or alcohol.  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  PAST GYN HISTORY:  She has had NSVDs x2.  She has a son who is 73 and daughter who is 88.  FAMILY HISTORY:  Her father died of PE at the age of 46.  She had a paternal grandfather with clotting disorder and a brother with clotting issues.  They have been worked up and felt that it may be Buerger's disease.  Her mother had hypertension, degenerative joint disease, obsessive compulsive disorder, and postmenopausal breast cancer.  She has had 2 brothers, 1 with bipolar and committed suicide, second brother was shot by his wife and subsequently committed  suicide as well.  There is history of skin cancer in the family.  PHYSICAL EXAMINATION:  VITAL SIGNS:  Weight 141 pounds, height 5 foot 3 for a BMI of 25.  Blood pressure 130/78, pulse 88, respirations 20, temperature 98. GENERAL:  Well-nourished, well-developed female, in no acute distress. NECK:  Supple.  There is no lymphadenopathy.  No thyromegaly. LUNGS:  Clear to auscultation bilaterally. CARDIOVASCULAR:  Regular rate and rhythm. ABDOMEN:  Soft, nontender, nondistended.  There are no palpable masses or hepatosplenomegaly.  Groins are negative for adenopathy. EXTREMITIES:  There is no edema. PELVIC:  External genitalia is within normal limits.  The vagina is well epithelialized.  The cervix is multiparous.  There is a large ectropion. There is no distinct lesion. BIMANUAL EXAMINATION:  The cervix is palpably normal.  The corpus  is of normal size, shape, and consistency.  ASSESSMENT:  A 45 year old gravida 3, para 2 with 2 separate processes. It appears that she has adenocarcinoma in situ of the cervix as well as complex hyperplasia with atypia.  I discussed with her that ultimately she will require hysterectomy; however, we need to proceed with a cold knife conization first to ensure that a simple hysterectomy will take care of both processes versus needing to undergo a radical hysterectomy should she have an invasive cervical adenocarcinoma.  She agrees to a cold knife conization.  This will be performed on April 08, 2011, by Dr. [name].  Risks and benefits of the procedure were discussed with the patient.  We will tentatively schedule her for her hysterectomy on October 9th.  If there is no evidence of adenocarcinoma of the endometrium at the time of frozen section, she would like to retain her ovaries.  She will need to see [place] for a postop visit in discussion of treatment planning prior to that time, so we will review her wishes for the time of hysterectomy and discuss that as a separate issue.  At this point, we will proceed with a cold knife conization and determine her disposition pending those results.     Jerrica Thorman A. Duard Brady, MD     PAG/MEDQ  D:  03/25/2011  T:  03/26/2011  Job:  [id number]  cc:   Titus Dubin. Alwyn Ren, MD,FACP,FCCP [phone] [place] [address]  Telford Nab, R.N. [address] [place], [place] [address]  Lendon Colonel, MD Fax: [phone]  Electronically Signed by Cleda Mccreedy MD on 03/26/2011 08:15:58 AM

## 2011-04-08 ENCOUNTER — Other Ambulatory Visit: Payer: Self-pay | Admitting: Gynecologic Oncology

## 2011-04-08 ENCOUNTER — Ambulatory Visit (HOSPITAL_COMMUNITY)
Admission: RE | Admit: 2011-04-08 | Discharge: 2011-04-08 | Disposition: A | Discharge status: Home / Self Care | Payer: BC Managed Care – PPO | Source: Ambulatory Visit | Attending: Gynecologic Oncology | Admitting: Gynecologic Oncology

## 2011-04-08 DIAGNOSIS — D069 Carcinoma in situ of cervix, unspecified: Secondary | ICD-10-CM | POA: Insufficient documentation

## 2011-04-08 LAB — CBC
MCH: 30.1 pg (ref 26.0–34.0)
MCHC: 33.4 g/dL (ref 30.0–36.0)
MCV: 90.1 fL (ref 78.0–100.0)
Platelets: 195 10*3/uL (ref 150–400)
RDW: 12.7 % (ref 11.5–15.5)
WBC: 3.3 10*3/uL — ABNORMAL LOW (ref 4.0–10.5)

## 2011-04-11 NOTE — Op Note (Signed)
  Nicole Dunn, Nicole Dunn                   ACCOUNT NO.:  [id number]  MEDICAL RECORD NO.:  [id number]  LOCATION:  DAYL                         FACILITY:  [facility]  PHYSICIAN:  [name], MD     DATE OF BIRTH:  04/10/1966  DATE OF PROCEDURE:  04/08/2011 DATE OF DISCHARGE:  04/08/2011                              OPERATIVE REPORT   PREOPERATIVE DIAGNOSIS:  Endocervical adenocarcinoma in situ.  POSTOPERATIVE DIAGNOSIS:  Endocervical adenocarcinoma in situ.  PROCEDURE:  Cervical cold knife cone.  SURGEON:  [name], MD  ASSISTANT:  Telford Nab, R.N.  ANESTHESIA:  General endotracheal.  FINDINGS:  Cervix with gross lesions approximately 11 o'clock. Transition zone clearly visible.  DESCRIPTION OF PROCEDURE:  The patient was taken to operating room, placed under general endotracheal anesthesia without any difficulty. She was placed in dorsal lithotomy position, prepped and draped and straight catheterized.  A tenaculum was placed on the anterior cervical lip.  The 8 o'clock sutures were placed along the cervical neck at 3 and 9 o'clock.  A 10 cc vasopressin was injected circumferentially into the cervix.  A large cone was excised.  Of note, endocervical canal was sounded, noted to be approximately 3.5 cm.  The base of the cone was resected with the scissors.  The operative hemostasis was obtained at the operative bed with the use of Bovie cautery and also Monsel's solution and pressure.  A tear was noted in the cervix at approximately 7 o'clock and this was repaired with an interrupted suture.  The hemostasis was completely achieved.  The 9 and 3 o'clock sutures were tied to each other and hemostasis was once again ascertained.  Sutures placed at the 12 o'clock position and specimen was sent to Pathology.  Sponges, instrument, and needle counts correct x3.  Estimated blood loss 325 cc.  Complications none.  Drains none.  DISPOSITION:  The patient was taken to recovery room  in stable condition.     [name], MD     WB/MEDQ  D:  04/08/2011  T:  04/09/2011  Job:  [id number]  cc:   Telford Nab, R.N. [address] [place], [place] [address]  Titus Dubin. Alwyn Ren, MD,FACP,FCCP [phone] [place] [address]  Lendon Colonel, MD Fax: [phone]  Electronically Signed by [name] MD on 04/11/2011 08:42:31 AM

## 2011-05-01 ENCOUNTER — Other Ambulatory Visit: Payer: Self-pay | Admitting: Internal Medicine

## 2011-05-01 MED ORDER — HYDROXYZINE PAMOATE 25 MG PO CAPS: 25.0000 mg | ORAL_CAPSULE | Freq: Three times a day (TID) | ORAL | Status: DC | PRN

## 2011-05-01 NOTE — Telephone Encounter (Signed)
RX sent

## 2011-05-12 HISTORY — PX: TOTAL ABDOMINAL HYSTERECTOMY: SHX209

## 2011-05-16 ENCOUNTER — Other Ambulatory Visit: Payer: Self-pay | Admitting: Obstetrics & Gynecology

## 2011-05-16 ENCOUNTER — Ambulatory Visit (HOSPITAL_COMMUNITY)
Admission: RE | Admit: 2011-05-16 | Discharge: 2011-05-16 | Disposition: A | Discharge status: Home / Self Care | Payer: BC Managed Care – PPO | Source: Ambulatory Visit | Attending: Obstetrics & Gynecology | Admitting: Obstetrics & Gynecology

## 2011-05-16 ENCOUNTER — Encounter (HOSPITAL_COMMUNITY): Payer: BC Managed Care – PPO

## 2011-05-16 ENCOUNTER — Other Ambulatory Visit: Payer: Self-pay | Admitting: Gynecologic Oncology

## 2011-05-16 ENCOUNTER — Ambulatory Visit: Payer: BC Managed Care – PPO | Attending: Gynecology | Admitting: Gynecology

## 2011-05-16 DIAGNOSIS — Z01818 Encounter for other preprocedural examination: Secondary | ICD-10-CM | POA: Insufficient documentation

## 2011-05-16 DIAGNOSIS — Z79899 Other long term (current) drug therapy: Secondary | ICD-10-CM | POA: Insufficient documentation

## 2011-05-16 DIAGNOSIS — Z01812 Encounter for preprocedural laboratory examination: Secondary | ICD-10-CM | POA: Insufficient documentation

## 2011-05-16 DIAGNOSIS — D069 Carcinoma in situ of cervix, unspecified: Secondary | ICD-10-CM | POA: Insufficient documentation

## 2011-05-16 DIAGNOSIS — Z87891 Personal history of nicotine dependence: Secondary | ICD-10-CM | POA: Insufficient documentation

## 2011-05-16 LAB — COMPREHENSIVE METABOLIC PANEL
AST: 19 U/L (ref 0–37)
Albumin: 3.8 g/dL (ref 3.5–5.2)
BUN: 11 mg/dL (ref 6–23)
Calcium: 9.2 mg/dL (ref 8.4–10.5)
Creatinine, Ser: 0.81 mg/dL (ref 0.50–1.10)
GFR calc non Af Amer: 87 mL/min — ABNORMAL LOW (ref >90)

## 2011-05-16 LAB — DIFFERENTIAL
Basophils Relative: 0 % (ref 0–1)
Eosinophils Absolute: 0 10*3/uL (ref 0.0–0.7)
Eosinophils Relative: 0 % (ref 0–5)
Monocytes Absolute: 0.3 10*3/uL (ref 0.1–1.0)
Monocytes Relative: 5 % (ref 3–12)
Neutrophils Relative %: 77 % (ref 43–77)

## 2011-05-16 LAB — CBC
MCH: 28.3 pg (ref 26.0–34.0)
MCHC: 31.8 g/dL (ref 30.0–36.0)
MCV: 89.2 fL (ref 78.0–100.0)
Platelets: 204 10*3/uL (ref 150–400)
RDW: 12.8 % (ref 11.5–15.5)

## 2011-05-16 LAB — HCG, SERUM, QUALITATIVE: Preg, Serum: NEGATIVE

## 2011-05-16 LAB — SURGICAL PCR SCREEN
MRSA, PCR: NEGATIVE
Staphylococcus aureus: NEGATIVE

## 2011-05-19 NOTE — Consult Note (Signed)
  NAME:  Nicole Dunn, Nicole Dunn                   ACCOUNT NO.:  [id number]  MEDICAL RECORD NO.:  [id number]  LOCATION:  GYN                          FACILITY:  [facility]  PHYSICIAN:  De Blanch, M.D.DATE OF BIRTH:  01-10-1966  DATE OF CONSULTATION: DATE OF DISCHARGE:                                CONSULTATION   CHIEF COMPLAINT:  Adenocarcinoma in situ of the cervix.  INTERVAL HISTORY:  The patient underwent a cold knife conization on April 08, 2011.  Final pathology showed an endocervical adenocarcinoma in situ.  There is no evidence of invasion, and margins were negative. The patient is also known to have endometrial atypical hyperplasia.  She wishes to proceed with hysterectomy for definitive therapy.  HISTORY OF PRESENT ILLNESS:  A 45 year old white female who has adenocarcinoma in situ of the cervix as well as endometrial complex hyperplasia with atypia.  PAST MEDICAL HISTORY: 1. 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 2. Plantar fasciitis. 3. Left knee meniscal disease, degenerative joint disease.  PAST SURGICAL HISTORY:  Surgery on the left foot for plantar fasciitis, left knee meniscus surgery in March 2012.  CURRENT MEDICATIONS:  Lyrica, Skelaxin, Vicodin p.r.n., Wellbutrin, Valtrex, and vitamin D.  ALLERGIES:  Drug allergies none.  SOCIAL HISTORY:  The patient does not smoke.  She is a respiratory therapist.  PAST GYNECOLOGIC HISTORY/OBSTETRICAL HISTORY:  Gravida 2.  FAMILY HISTORY:  Negative for gynecologic cancers and postmenopausal breast cancer.  REVIEW OF SYSTEMS:  10-point comprehensive review of systems negative except as noted above.  PHYSICAL EXAMINATION:  VITAL SIGNS:  Weight 143 pounds, blood pressure 118/72, pulse 76, respiratory rate 16, and temperature 98. GENERAL:  The patient is a healthy white female, in no acute distress. HEENT:  Negative. NECK:  Supple without thyromegaly or supraclavicular or inguinal adenopathy. ABDOMEN:  Soft,  nontender.  No masses, organomegaly, ascites, or hernias noted. PELVIC EXAM:  EGBUS, vagina, bladder, and urethra are normal.  The cervix is healing well following the conization.  There is no evidence of inflammation or infection.  Bimanual exam reveals an anterior uterus normal shape, size, and consistency.  There is no adnexal masses noted. EXTREMITIES:  Lower extremities, edema or varicosities.  IMPRESSION:  Adenocarcinoma in situ of the cervix and atypical endometrial hyperplasia with atypia.  The patient is scheduled to undergo robotic hysterectomy, next Tuesday with Dr. Duard Brady.  The risks of surgery including hemorrhage, infection, injury to adjacent viscera, thrombolic complications, and anesthetic risks are reviewed. All the patient's questions were answered and she wishes to proceed with surgery as scheduled.     De Blanch, M.D.     DC/MEDQ  D:  05/16/2011  T:  05/16/2011  Job:  [id number]  cc:   Telford Nab, R.N. [address] [place], [place] [address]  Titus Dubin. Alwyn Ren, MD,FACP,FCCP [phone] [place] [address]  Electronically Signed by De Blanch M.D. on 05/19/2011 09:12:00 AM

## 2011-05-20 ENCOUNTER — Ambulatory Visit (HOSPITAL_COMMUNITY)
Admission: RE | Admit: 2011-05-20 | Discharge: 2011-05-21 | Disposition: A | Discharge status: Home / Self Care | Payer: BC Managed Care – PPO | Source: Ambulatory Visit | Attending: Obstetrics & Gynecology | Admitting: Obstetrics & Gynecology

## 2011-05-20 ENCOUNTER — Other Ambulatory Visit: Payer: Self-pay | Admitting: Gynecologic Oncology

## 2011-05-20 DIAGNOSIS — D069 Carcinoma in situ of cervix, unspecified: Secondary | ICD-10-CM | POA: Insufficient documentation

## 2011-05-20 DIAGNOSIS — Z01812 Encounter for preprocedural laboratory examination: Secondary | ICD-10-CM | POA: Insufficient documentation

## 2011-05-20 DIAGNOSIS — Z79899 Other long term (current) drug therapy: Secondary | ICD-10-CM | POA: Insufficient documentation

## 2011-05-20 DIAGNOSIS — D649 Anemia, unspecified: Secondary | ICD-10-CM | POA: Insufficient documentation

## 2011-05-20 HISTORY — PX: ENDOMETRIAL BIOPSY: SHX622

## 2011-05-20 LAB — SAMPLE TO BLOOD BANK

## 2011-05-20 NOTE — Op Note (Unsigned)
NAME:  Nicole Dunn, Nicole Dunn                   ACCOUNT NO.:  [id number]  MEDICAL RECORD NO.:  [id number]  LOCATION:  1536                         FACILITY:  [facility]  PHYSICIAN:  Platon Arocho A. Duard Brady, MD    DATE OF BIRTH:  1965-10-06  DATE OF PROCEDURE:  05/20/2011 DATE OF DISCHARGE:                              OPERATIVE REPORT   PREOPERATIVE DIAGNOSIS:  Adenocarcinoma in situ of the cervix.  POSTOPERATIVE DIAGNOSIS:  Adenocarcinoma in situ of the cervix.  PROCEDURE:  Total robotic hysterectomy, bilateral salpingo oophorectomy. Salpingectomy.  SURGEONS:  Maitlyn Penza A. Duard Brady, MD and Roseanna Rainbow, MD.  ASSISTANTMarland Kitchen  Aundria Rud.  ANESTHESIA:  General.  ESTIMATED BLOOD LOSS:  50 mL.  URINE OUTPUT:  100 mL.  IV FLUIDS:  1700 mL.  COMPLICATIONS:  None.  SPECIMENS:  Uterus and bilateral fallopian tubes.  OPERATIVE FINDINGS:  A cervix that was status post conization significantly foreshortened the sutures from her prior surgery in place. Normal appearing uterus.  Normal anatomic survey of the abdomen.  Normal- appearing tubes and ovaries bilaterally.  The patient was identified in the preoperative holding area.  Informed consent was signed on the chart.  Risks and benefits of the procedure were discussed with the patient.  Then we added bilateral salpingectomy to her operative consent.  Examination was performed and there was no interval change of status.  Her last menstrual period was September 21 she had a urinary negative urine pregnancy test on May 16, 2011.  She wished to proceed with the surgery.  The patient was taken to the operating room, placed in supine position. Her arms were tucked at her sides with all appropriate precautions while she was awake.  General anesthesia was then induced.  An OG tube was placed to suction.  She was then placed in dorsal lithotomy position. Shoulder blocks were placed in usual fashion.  SCDs were on.  A time-out confirmed the patient the  procedure, antibiotic, allergy status.  The abdomen was prepped with ChloraPrep.  The perineum was prepped with Betadine.  Foley catheter was inserted in the bladder under sterile conditions.  Sterile speculum was placed into the vagina.  The findings as above were noted.  The cervix was grasped with single-tooth tenaculum.  It was dilated without difficulty.  The ZUMI with a medium KOH was placed without difficulty.  The patient was then draped after allowing the prep to dry.  After confirming OG tube placement with suction a second time-out was performed to confirm the patient, the procedure, antibiotic, allergy status and staff.  There were no surgical concerns.  After anesthetizing the skin with 0.25% Marcaine, a 5-mm incision was made in the left upper quadrant in the midclavicular line 2 cm below the costal margin.  Using a 5 mm Optiview intraabdominal placement was confirmed.  At this point, the abdomen was insufflated with CO2 gas.  At no point did the intraabdominal pressure increase over 15 mmHg.  After the abdomen was insufflated, she was placed in Trendelenburg position.  A 10 mm umbilical port was placed and bilateral 8 mm robotic trocars were placed under direct visualization.  The instruments were inserted. The round ligament was  transected on the right side.  The anterior posterior leaves of broad ligament were opened.  The ureter was identified on her right.  Window was made between the IP and the ureter. The utero-ovarian was then transected after coagulation.  The right fallopian tube similarly was cauterized and then transected to allow sparing of the ovary.  The uterine vessels were skeletonized.  The uterine was coagulated with bipolar cautery and a C-loop was created. The bladder flap was created anteriorly.  Attention was drawn to the left side.  There is some adhesive disease of the rectosigmoid colon to the left adnexa.  This was taken down using sharp  dissection.  The round ligament was transected using monopolar cautery.  The bladder flap was completed anteriorly and the posterior leaf of the broad ligament was opened.  The ureter was identified.  A window was made between the IP and the ureter.  The utero-ovarian on her left side was taken after coagulation with bipolar cautery.  Similarly,  the fallopian tube at the level of the cornua was cauterized and then transected with monopolar cautery.  The uterine vessels were skeletonized bipolar cautery was used for coagulation and they were transected after insufflating the pneumo occluder balloon the colpotomy was performed in a circumferential fashion.  The specimen was delivered without difficulty.  The vaginal cuff was closed using a running suture of 0 Vicryl on a CT-1 in a continuous fashion.  The fallopian tubes bilaterally were then removed from the ovary and bilateral salpingectomies were performed. The fallopian tubes were removed through the 10-12 assistant port.  The abdomen was inspected, all pedicle sites were noted to be hemostatic. Abdomen was irrigated without difficulty.  Under direct visualization. Instruments removed as were the robotic trocars.  The CO2 gas was removed from the abdomen and all of the trocar sites removed. The fascia was grasped in the umbilical port p.o. was closed with a running suture of figure-of-eight fashion of 0 Vicryl on a UR 6. Similarly the fascia in the left upper quadrant, a October, 10-12 port was closed.  The skin was closed using 4-0 Vicryl.  Steri-Strips and benzoin were applied.  The vagina was swabbed and noted to be hemostatic.  All instrument, needle, and Ray-Tec counts were correct x2.  The patient was taken to recovery room in stable condition.     Ladarion Munyon A. Duard Brady, MD     PAG/MEDQ  D:  05/20/2011  T:  05/20/2011  Job:  [id number]  cc:   Lendon Colonel, MD Fax: [phone]  Telford Nab, R.N. [phone] [address] [place], [place] [address]

## 2011-05-21 LAB — CBC
MCH: 27.8 pg (ref 26.0–34.0)
MCHC: 31.3 g/dL (ref 30.0–36.0)
MCV: 88.9 fL (ref 78.0–100.0)
Platelets: 141 10*3/uL — ABNORMAL LOW (ref 150–400)
RDW: 12.7 % (ref 11.5–15.5)

## 2011-05-22 NOTE — Op Note (Signed)
NAME:  Nicole Dunn, Nicole Dunn                   ACCOUNT NO.:  [id number]  MEDICAL RECORD NO.:  [id number]  LOCATION:  1536                         FACILITY:  [facility]  PHYSICIAN:  Jeronda Don A. Duard Brady, MD    DATE OF BIRTH:  03/10/1966  DATE OF PROCEDURE:  05/20/2011 DATE OF DISCHARGE:  05/21/2011                              OPERATIVE REPORT   PREOPERATIVE DIAGNOSIS:  Adenocarcinoma in situ of the cervix.  POSTOPERATIVE DIAGNOSIS:  Adenocarcinoma in situ of the cervix.  PROCEDURE:  Total robotic hysterectomy, bilateral salpingo oophorectomy. Salpingectomy.  SURGEONS:  Iszabella Hebenstreit A. Duard Brady, MD and Roseanna Rainbow, MD.  ASSISTANTMarland Kitchen  Aundria Rud.  ANESTHESIA:  General.  ESTIMATED BLOOD LOSS:  50 mL.  URINE OUTPUT:  100 mL.  IV FLUIDS:  1700 mL.  COMPLICATIONS:  None.  SPECIMENS:  Uterus and bilateral fallopian tubes.  OPERATIVE FINDINGS:  A cervix that was status post conization significantly foreshortened the sutures from her prior surgery in place. Normal appearing uterus.  Normal anatomic survey of the abdomen.  Normal- appearing tubes and ovaries bilaterally.  The patient was identified in the preoperative holding area.  Informed consent was signed on the chart.  Risks and benefits of the procedure were discussed with the patient.  Then we added bilateral salpingectomy to her operative consent.  Examination was performed and there was no interval change of status.  Her last menstrual period was September 21 she had a urinary negative urine pregnancy test on May 16, 2011.  She wished to proceed with the surgery.  The patient was taken to the operating room, placed in supine position. Her arms were tucked at her sides with all appropriate precautions while she was awake.  General anesthesia was then induced.  An OG tube was placed to suction.  She was then placed in dorsal lithotomy position. Shoulder blocks were placed in usual fashion.  SCDs were on.  A time-out confirmed the  patient the procedure, antibiotic, allergy status.  The abdomen was prepped with ChloraPrep.  The perineum was prepped with Betadine.  Foley catheter was inserted in the bladder under sterile conditions.  Sterile speculum was placed into the vagina.  The findings as above were noted.  The cervix was grasped with single-tooth tenaculum.  It was dilated without difficulty.  The ZUMI with a medium KOH was placed without difficulty.  The patient was then draped after allowing the prep to dry.  After confirming OG tube placement with suction a second time-out was performed to confirm the patient, the procedure, antibiotic, allergy status and staff.  There were no surgical concerns.  After anesthetizing the skin with 0.25% Marcaine, a 5-mm incision was made in the left upper quadrant in the midclavicular line 2 cm below the costal margin.  Using a 5 mm Optiview intraabdominal placement was confirmed.  At this point, the abdomen was insufflated with CO2 gas.  At no point did the intraabdominal pressure increase over 15 mmHg.  After the abdomen was insufflated, she was placed in Trendelenburg position.  A 10 mm umbilical port was placed and bilateral 8 mm robotic trocars were placed under direct visualization.  The instruments were inserted. The round  ligament was transected on the right side.  The anterior posterior leaves of broad ligament were opened.  The ureter was identified on her right.  Window was made between the IP and the ureter. The utero-ovarian was then transected after coagulation.  The right fallopian tube similarly was cauterized and then transected to allow sparing of the ovary.  The uterine vessels were skeletonized.  The uterine was coagulated with bipolar cautery and a C-loop was created. The bladder flap was created anteriorly.  Attention was drawn to the left side.  There is some adhesive disease of the rectosigmoid colon to the left adnexa.  This was taken down using  sharp dissection.  The round ligament was transected using monopolar cautery.  The bladder flap was completed anteriorly and the posterior leaf of the broad ligament was opened.  The ureter was identified.  A window was made between the IP and the ureter.  The utero-ovarian on her left side was taken after coagulation with bipolar cautery.  Similarly,  the fallopian tube at the level of the cornua was cauterized and then transected with monopolar cautery.  The uterine vessels were skeletonized bipolar cautery was used for coagulation and they were transected after insufflating the pneumo occluder balloon the colpotomy was performed in a circumferential fashion.  The specimen was delivered without difficulty.  The vaginal cuff was closed using a running suture of 0 Vicryl on a CT-1 in a continuous fashion.  The fallopian tubes bilaterally were then removed from the ovary and bilateral salpingectomies were performed. The fallopian tubes were removed through the 10-12 assistant port.  The abdomen was inspected, all pedicle sites were noted to be hemostatic. Abdomen was irrigated without difficulty.  Under direct visualization. Instruments removed as were the robotic trocars.  The CO2 gas was removed from the abdomen and all of the trocar sites removed. The fascia was grasped in the umbilical port p.o. was closed with a running suture of figure-of-eight fashion of 0 Vicryl on a UR 6. Similarly the fascia in the left upper quadrant, a October, 10-12 port was closed.  The skin was closed using 4-0 Vicryl.  Steri-Strips and benzoin were applied.  The vagina was swabbed and noted to be hemostatic.  All instrument, needle, and Ray-Tec counts were correct x2.  The patient was taken to recovery room in stable condition.     Meylin Stenzel A. Duard Brady, MD     PAG/MEDQ  D:  05/20/2011  T:  05/21/2011  Job:  [id number]  cc:   Lendon Colonel, MD Fax: [phone]  Telford Nab, R.N. [phone] [address] [place], [place] [address]  Electronically Signed by Cleda Mccreedy MD on 05/22/2011 08:24:47 AM

## 2011-06-05 ENCOUNTER — Ambulatory Visit: Payer: BC Managed Care – PPO | Attending: Gynecologic Oncology | Admitting: Gynecologic Oncology

## 2011-06-05 DIAGNOSIS — D069 Carcinoma in situ of cervix, unspecified: Secondary | ICD-10-CM | POA: Insufficient documentation

## 2011-06-05 DIAGNOSIS — Z90710 Acquired absence of both cervix and uterus: Secondary | ICD-10-CM | POA: Insufficient documentation

## 2011-06-05 DIAGNOSIS — Z9079 Acquired absence of other genital organ(s): Secondary | ICD-10-CM | POA: Insufficient documentation

## 2011-06-18 NOTE — Consult Note (Signed)
  Nicole Dunn, Nicole Dunn                   ACCOUNT NO.:  [id number]  MEDICAL RECORD NO.:  [id number]  LOCATION:  GYN                          FACILITY:  [facility]  PHYSICIAN:  [name], MD     DATE OF BIRTH:  1966-04-04  DATE OF CONSULTATION:  06/05/2011 DATE OF DISCHARGE:  06/05/2011                                CONSULTATION   REASON FOR VISIT:  Postoperative check, adenocarcinoma in situ of the cervix.  HISTORY OF PRESENT ILLNESS:  Nicole Dunn is seen on June 05, 2011.  She is status post a total robotic hysterectomy, bilateral salpingo- oophorectomy, and salpingectomy for adenocarcinoma in situ of the cervix.  This procedure was completed on May 20, 2011.  Final pathology was notable for a cervix with evidence of inflammation and fibrosis with no evidence of residual adenocarcinoma in situ.  The endometrium identified no evidence of hyperplasia or carcinoma.  The right and left fallopian tubes were remarkable.  Nicole Dunn states that she has done well.  Denies any nausea, vomiting, abdominal pain.  Denies diarrhea, constipation.  Denies any vaginal bleeding or urinary incontinence.  PAST MEDICAL HISTORY:  No interval changes.  PAST SURGICAL HISTORY:  Interval robotic assisted total hysterectomy, bilateral salpingo-oophorectomy on May 20, 2011.  PHYSICAL EXAMINATION:  GENERAL:  A well-developed female, in no acute distress. VITAL SIGNS:  Weight 136.9 pounds, blood pressure 122/60, pulse of 78, temperature 98.1. CHEST:  Clear to auscultation. ABDOMEN:  Soft, nontender, robotic port sites intact, suture removed from the umbilical lesion. PELVIC:  Normal external genitalia, Bartholin's, urethra, and Skene's. Vaginal cuff is intact.  No bleeding or vaginal discharge.  No pooling. No evidence of vaginal cuff separation. MANUAL:  Notable for the absence of any fullness.  IMPRESSION AND PLAN:  Status post robotic assisted total laparoscopic hysterectomy, bilateral  salpingo-oophorectomy.  Nicole Dunn is doing well. She has been advised that she may return to work.  Pelvic rest is recommended for several more weeks.     [name], MD     WB/MEDQ  D:  06/13/2011  T:  06/14/2011  Job:  [id number]  cc:   Lendon Colonel, MD Fax: [phone]  Telford Nab, R.N. [phone] [address] [place], [place] [address]  Electronically Signed by [name] MD on 06/18/2011 07:22:01 AM

## 2011-07-10 ENCOUNTER — Encounter: Payer: Self-pay | Admitting: Gynecologic Oncology

## 2011-07-10 ENCOUNTER — Ambulatory Visit: Payer: BC Managed Care – PPO | Attending: Gynecologic Oncology | Admitting: Gynecologic Oncology

## 2011-07-10 DIAGNOSIS — D069 Carcinoma in situ of cervix, unspecified: Secondary | ICD-10-CM

## 2011-07-10 DIAGNOSIS — Z79899 Other long term (current) drug therapy: Secondary | ICD-10-CM | POA: Insufficient documentation

## 2011-07-10 DIAGNOSIS — Z9079 Acquired absence of other genital organ(s): Secondary | ICD-10-CM | POA: Insufficient documentation

## 2011-07-10 DIAGNOSIS — K219 Gastro-esophageal reflux disease without esophagitis: Secondary | ICD-10-CM | POA: Insufficient documentation

## 2011-07-10 DIAGNOSIS — C539 Malignant neoplasm of cervix uteri, unspecified: Secondary | ICD-10-CM | POA: Insufficient documentation

## 2011-07-10 DIAGNOSIS — N8500 Endometrial hyperplasia, unspecified: Secondary | ICD-10-CM | POA: Insufficient documentation

## 2011-07-10 DIAGNOSIS — Z90710 Acquired absence of both cervix and uterus: Secondary | ICD-10-CM | POA: Insufficient documentation

## 2011-07-10 DIAGNOSIS — Z8541 Personal history of malignant neoplasm of cervix uteri: Secondary | ICD-10-CM | POA: Insufficient documentation

## 2011-07-10 NOTE — Progress Notes (Signed)
Consult Note: Gyn-Onc  Nicole Dunn 45 y.o. female  CC:  Chief Complaint  Patient presents with  . Follow-up    Cervical cancer    HPI:  This is a 45 year old gravida 3 para 2 with an abnormal Pap test indicating atypical glandular cells. Evaluation was notable for endocervical adenocarcinoma in situ and endometrial hyperplasia with atypia. A subsequent cold knife cone biopsy was performed and did not identify any invasive disease   Interval History: On May 20 2011 she underwent a robot-assisted total laparoscopic hysterectomy bilateral salpingectomies.  Final pathology was without any evidence of residual adenocarcinoma in situ of the cervix or hyperplasia of the endometrium. The patient reports vaginal discharge and reports sexual activity Performance status: 0  Review of Systems:  Constitutional  Feels well  Skin/Breast  No rash, sores, jaundice, itching, dryness, lumps, swelling, breast pain, or nipple discharge. Age spots  Cardiovascular  No chest pain, shortness of breath, or edema  Pulmonary  No cough or wheeze.  Gastro Intestinal  No nausea, vomitting, or diarrhoea. No bright red blood per rectum, no abdominal pain, change in bowel movement, or constipation.  Genito Urinary  No frequency, urgency, dysuria, reports vaginal bleeding. Is currently sexually active Musculo Skeletal  No myalgia, arthralgia, joint swelling or pain  Neurologic  No weakness, numbness, change in gait,  Psychology  No depression, anxiety, insomnia.    Current Meds:  Outpatient Encounter Prescriptions as of 07/10/2011  Medication Sig Dispense Refill  . buPROPion (WELLBUTRIN XL) 300 MG 24 hr tablet Take 1 tablet (300 mg total) by mouth daily.  90 tablet  3  . HYDROcodone-acetaminophen (VICODIN ES) 7.5-750 MG per tablet Take 1 tablet by mouth daily.        . hydrOXYzine (VISTARIL) 25 MG capsule Take 1 capsule (25 mg total) by mouth 3 (three) times daily as needed for itching.  30 capsule  0    . metaxalone (SKELAXIN) 800 MG tablet Take 800 mg by mouth as directed.        . pregabalin (LYRICA) 100 MG capsule Take 100 mg by mouth as directed. Take one tablet in AM and 1.5 tabs in PM       . eletriptan (RELPAX) 20 MG tablet One tablet by mouth as needed for migraine headache.  If the headache improves and then returns, dose may be repeated after 2 hours have elapsed since first dose (do not exceed 80 mg per day). may repeat in 2 hours if necessary         Allergy:  Allergies  Allergen Reactions  . Escitalopram Oxalate     Sexual dysfunction  . Topiramate     Mood change, anger    Social Hx:   History   Social History  . Marital Status: Married    Spouse Name: N/A    Number of Children: N/A  . Years of Education: N/A   Occupational History  . Not on file.   Social History Main Topics  . Smoking status: Former Smoker    Quit date: 12/10/1998  . Smokeless tobacco: Not on file  . Alcohol Use: Yes     rarely  . Drug Use: No  . Sexually Active: Yes   Other Topics Concern  . Not on file   Social History Narrative  . No narrative on file    Past Surgical Hx:  Past Surgical History  Procedure Date  . Tonsillectomy   . Endoscopic plantar fasciotomy 2011    left foot,  Nicole. Charlsie Dunn  . Otic tubes     bilaterally  . Meniscus repair 2012    Nicole Dunn  . Abdominal hysterectomy 05/20/11    Biopsy on 04/08/11    Past Medical Hx:  Past Medical History  Diagnosis Date  . Common migraine     with prodrome visual auro, "black spot with blind peripheral ring"  . Degenerative disc disease     with C6-7 encroachment & C3-4 narrowing  . Degenerative disc disease, lumbar     L5-S1, Nicole. Nickola Dunn  . Anxiety   . GERD (gastroesophageal reflux disease)     Family Hx:  Family History  Problem Relation Age of Onset  . Hypertension Father   . Deep vein thrombosis Father   . Pulmonary embolism Father   . Hypertension Mother   . Kidney failure Mother      transitory/iatrogenic  . Liver disease Mother     hepatic insufficiency  . Peripheral vascular disease Brother   . Clotting disorder Paternal Grandfather   . Pulmonary embolism Paternal Grandfather   . Bipolar disorder      sister & bro  . Depression      sister & bro  . Heart attack Maternal Grandmother     in 15s  . OCD Mother     Vitals:  Blood pressure 92/64, pulse 58, temperature 97.9 F (36.6 C), resp. rate 16, height 5' 7.32" (1.71 m), weight 138 lb 11.2 oz (62.914 kg).  Physical Exam: Well-developed female no acute distress Neck  Supple without any enlargements.  Cardiovascular  Pulse normal rate, regularity and rhythm. S1 and S2 normal, without any murmur, rub, or gallop.  Lungs  Clear to auscultation bilateraly, without wheezes/crackles/rhonchi. Good air movement.  Skin  No rash/lesions/breakdown  Psychiatry  Alert and oriented to person, place, and time  Abdomen  Normoactive bowel sounds, abdomen soft, non-tender and obese. Laparoscopic surgical  sites intact without evidence of hernia.  Genito Urinary  Vulva/vagina: Normal external female genitalia.  No lesions.   Bladder/urethra:  No lesions or masses  Vagina:6 mm mid vaginal cuff separation with granulation tissue present Silver nitrate was applied to the anterior vaginal cuff.   Extremities  No bilateral cyanosis, clubbing or edema.   Assessment/Plan:  Cervical adenocarcinoma in situ, endometrial hyperplasia with atypia.  No residual tissue identified in the cervical and hysterectomy specimens. The fallopian tubes were within normal limits. The ovaries are still in situ.  Nicole Dunn has been advised to continue pelvic rest for an additional 4 weeks. She's been advised to followup with Nicole. Lisbeth Dunn for routine GYN evaluations. She will still requires vaginal Pap test given the history of cervical adenocarcinoma in situ. I recommend Pap and HPV DNA co testing every 3 years for the next 20  years.   Nicole Schimke, MD 07/10/2011, 4:57 PM

## 2011-07-10 NOTE — Patient Instructions (Signed)
Follow up as recommended. Refrain from vaginal intercourse for 4 week

## 2011-07-14 ENCOUNTER — Telehealth: Payer: Self-pay | Admitting: Internal Medicine

## 2011-07-14 NOTE — Telephone Encounter (Signed)
Pt denies any Chest pain, SOB, Numbness or tingling in arms or legs, blurred vision, headache. Pt note that symptoms seem to occur at bedtime. Pt notes that she has had EKG/cardiac testing which R/O any heart related issue at her job. Pt scheduled to come in Thursday earliest time available with Pt schedule. Pt advise ED if symptoms increase or change, Pt ok verbalized understanding.

## 2011-07-15 ENCOUNTER — Encounter: Payer: Self-pay | Admitting: Internal Medicine

## 2011-07-17 ENCOUNTER — Encounter: Payer: Self-pay | Admitting: Internal Medicine

## 2011-07-17 ENCOUNTER — Ambulatory Visit (INDEPENDENT_AMBULATORY_CARE_PROVIDER_SITE_OTHER): Payer: BC Managed Care – PPO | Admitting: Internal Medicine

## 2011-07-17 DIAGNOSIS — D069 Carcinoma in situ of cervix, unspecified: Secondary | ICD-10-CM

## 2011-07-17 DIAGNOSIS — R002 Palpitations: Secondary | ICD-10-CM

## 2011-07-17 DIAGNOSIS — Z0000 Encounter for general adult medical examination without abnormal findings: Secondary | ICD-10-CM

## 2011-07-17 LAB — CBC WITH DIFFERENTIAL/PLATELET
Basophils Absolute: 0 10*3/uL (ref 0.0–0.1)
Basophils Relative: 0.5 % (ref 0.0–3.0)
Eosinophils Absolute: 0 10*3/uL (ref 0.0–0.7)
Eosinophils Relative: 0.6 % (ref 0.0–5.0)
HCT: 36.2 % (ref 36.0–46.0)
Hemoglobin: 11.9 g/dL — ABNORMAL LOW (ref 12.0–15.0)
Lymphocytes Relative: 26.1 % (ref 12.0–46.0)
Lymphs Abs: 1.4 10*3/uL (ref 0.7–4.0)
MCHC: 32.9 g/dL (ref 30.0–36.0)
MCV: 85.7 fl (ref 78.0–100.0)
Monocytes Absolute: 0.4 10*3/uL (ref 0.1–1.0)
Monocytes Relative: 7.4 % (ref 3.0–12.0)
Neutro Abs: 3.4 10*3/uL (ref 1.4–7.7)
Neutrophils Relative %: 65.4 % (ref 43.0–77.0)
Platelets: 187 10*3/uL (ref 150.0–400.0)
RBC: 4.22 Mil/uL (ref 3.87–5.11)
RDW: 15 % — ABNORMAL HIGH (ref 11.5–14.6)
WBC: 5.3 10*3/uL (ref 4.5–10.5)

## 2011-07-17 LAB — BASIC METABOLIC PANEL WITH GFR
BUN: 13 mg/dL (ref 6–23)
CO2: 23 meq/L (ref 19–32)
Calcium: 9.3 mg/dL (ref 8.4–10.5)
Chloride: 110 meq/L (ref 96–112)
Creatinine, Ser: 1 mg/dL (ref 0.4–1.2)
GFR: 64.44 mL/min
Glucose, Bld: 81 mg/dL (ref 70–99)
Potassium: 4.3 meq/L (ref 3.5–5.1)
Sodium: 142 meq/L (ref 135–145)

## 2011-07-17 LAB — T4, FREE: Free T4: 0.74 ng/dL (ref 0.60–1.60)

## 2011-07-17 LAB — TSH: TSH: 1.06 u[IU]/mL (ref 0.35–5.50)

## 2011-07-17 LAB — MAGNESIUM: Magnesium: 2.3 mg/dL (ref 1.5–2.5)

## 2011-07-17 NOTE — Patient Instructions (Signed)
To prevent palpitations or premature beats, avoid stimulants such as decongestants, diet pills, nicotine, or caffeine (coffee, tea, cola, or chocolate) to excess.   

## 2011-07-17 NOTE — Progress Notes (Signed)
  Subjective:    Patient ID: Nicole Dunn, female    DOB: 1966/05/07, 45 y.o.   MRN: [id number]  HPI Palpitations Onset: 6/12 @ [facility] as incidental finding Course: worse in past 3 months Character : pauses & heavier beat occurring daily, essentially constant, more noticeable @ rest, not worse with exertion Constitutional:no fever, chills, sweats, weight change, fatigue, sleep issues Cardiovascular:no chest pain, syncope, diaphoresis, claudication GI:no change in bowels, anorexia Derm:no skin, hair, or nail changes Neurologic:no tremor, numbness and tingling (PMH ?cervical & lumbar radiculopathy) Psych:no significant anxiety, depression, panic attacks Endocrine:no hoarseness, temperature intolerance Possible triggers:no new medications, no excess stimulants(decongestants, diet pills, nicotine, caffeine).  There is no family history of thyroid issues or dysrhythmias. She has no past history of thyroid disease.  She had no chemotherapy following the gynecologic surgery.  She is not engaged in a cardiovascular exercise program, which is very physically active at work. She also is involved in yoga    Review of Systems  She has been eating increased amounts of ice recently. She denies abdominal pain, melena, rectal bleeding.     Objective:   Physical Exam  Gen.: Thin but well-nourished; in no acute distress Eyes: Extraocular motion intact; no lid lag or proptosis Neck: Thyroid is normal without enlargement or nodularity Heart: Normal rhythm and rate without significant murmur, gallop. Occasional  extrabeats Lungs: Chest clear to auscultation without rales,rales, wheezes. Abdomen: bowel sounds normal, soft and non-tender without masses, organomegaly or hernias noted.  No guarding or rebound  Lymphatic: No lymphadenopathy is noted about the head, neck, axilla  Neuro:Deep tendon reflexes are equal and within normal limits; no tremor  Skin: Warm and dry without significant lesions or  rashes; no onycholysis Psych: Normally communicative and interactive; no abnormal mood or affect clinically.         Assessment & Plan:  #1 symptomatic premature beats with both PVCs and PACs. There may be multifocal PVCs based on current EKG. In June she had an isolated PAC without PVCs. The underlying EKG 01/16/11 had nonspecific ST-T wave changes in leads 1,2 and 3 in a / ectopic beat. There are no definite ischemic changes on either EKG.  Plan: Thyroid function test and chemistries will be checked. A Cardiology evaluation as appropriate because of the presence of possible multifocal PVCs.

## 2011-07-29 ENCOUNTER — Ambulatory Visit (INDEPENDENT_AMBULATORY_CARE_PROVIDER_SITE_OTHER): Payer: BC Managed Care – PPO | Admitting: Cardiovascular Disease

## 2011-07-29 ENCOUNTER — Encounter: Payer: Self-pay | Admitting: Cardiovascular Disease

## 2011-07-29 DIAGNOSIS — I4949 Other premature depolarization: Secondary | ICD-10-CM

## 2011-07-29 DIAGNOSIS — I493 Ventricular premature depolarization: Secondary | ICD-10-CM | POA: Insufficient documentation

## 2011-07-29 DIAGNOSIS — F411 Generalized anxiety disorder: Secondary | ICD-10-CM

## 2011-07-29 DIAGNOSIS — K219 Gastro-esophageal reflux disease without esophagitis: Secondary | ICD-10-CM

## 2011-07-29 NOTE — Assessment & Plan Note (Signed)
High autonomic tone with marked sinus arrythmia during exam.  No other evidence of structural heart disease.  Will need ETT to make sure PVC;s not worse with exercise., echo to R/O structural heart disease and 24 hour monitor to quantitate number /24hrs to see if Rx needed.

## 2011-07-29 NOTE — Assessment & Plan Note (Signed)
Medications not ideal in regard to arrythmia but they are not new and will leave it up to Dr Alwyn Ren to change meds for migraine and anxiety

## 2011-07-29 NOTE — Assessment & Plan Note (Signed)
Continue over the counter H2 blocker and antacids. Limit carb intake and caffeine

## 2011-07-29 NOTE — Patient Instructions (Signed)
Your physician recommends that you schedule a follow-up appointment in: AFTER TEST DONE SEE DR [facility]  Your physician recommends that you continue on your current medications as directed. Please refer to the Current Medication list given to you today. Your physician has requested that you have an echocardiogram. Echocardiography is a painless test that uses sound waves to create images of your heart. It provides your doctor with information about the size and shape of your heart and how well your heart's chambers and valves are working. This procedure takes approximately one hour. There are no restrictions for this procedure. DX PVC Your physician has requested that you have an exercise tolerance test. For further information please visit [email or web address] Please also follow instruction sheet, as given.  DX PVC Your physician has recommended that you wear a holter monitor. Holter monitors are medical devices that record the heart's electrical activity. Doctors most often use these monitors to diagnose arrhythmias. Arrhythmias are problems with the speed or rhythm of the heartbeat. The monitor is a small, portable device. You can wear one while you do your normal daily activities. This is usually used to diagnose what is causing palpitations/syncope (passing out). 24 HOUR   DX PVC

## 2011-07-29 NOTE — Progress Notes (Signed)
Patient ID: Nicole Dunn, female   DOB: 1966/08/10, 45 y.o.   MRN: [id number] 45 yo respitory Rx referred by Dr Alwyn Ren for PVC;s.  Some stress over last few months as she required a hysterectomy.  Palpitations worse over last few months.  No long runs just flip flops and patient uses the term occasional bigeminy.  Symptoms clearly worse at rest and when she is quiet.  No problems with exercise.  No previous cardiac issues and no family history of arrythmia, tachycardia, syncope or sudden death.  No recent med changes.  Ovaries intact with surgery.  Has long shifts at work but no great change there or with sleep habits.  Reviewed labs from Dr 07/22/11 normal including thyroid.  Some caffeine but not excessive and no stimulants.  Had some PVC;s on monitor during hysterectomy but no preop clearence needed and no NSVT.    Reviewed ECG from 01/16/11  NSR 67 one PAC and sinus arrythmia Reviewed ECG from 07/17/11 NSR rate 78 PAC and PVC  ROS: Denies fever, malais, weight loss, blurry vision, decreased visual acuity, cough, sputum, SOB, hemoptysis, pleuritic pain, palpitaitons, heartburn, abdominal pain, melena, lower extremity edema, claudication, or rash.  All other systems reviewed and negative   General: Affect appropriate Healthy:  appears stated age HEENT: normal Neck supple with no adenopathy JVP normal no bruits no thyromegaly Lungs clear with no wheezing and good diaphragmatic motion Heart:  S1/S2 no murmur,rub, gallop or click PMI normal Abdomen: benighn, BS positve, no tenderness, no AAA no bruit.  No HSM or HJR Distal pulses intact with no bruits No edema Neuro non-focal Skin warm and dry No muscular weakness  Medications Current Outpatient Prescriptions  Medication Sig Dispense Refill  . buPROPion (WELLBUTRIN XL) 300 MG 24 hr tablet Take 1 tablet (300 mg total) by mouth daily.  90 tablet  3  . HYDROcodone-acetaminophen (VICODIN ES) 7.5-750 MG per tablet Take 1 tablet by mouth every 8  (eight) hours as needed.       . hydrOXYzine (VISTARIL) 25 MG capsule Take 1 capsule (25 mg total) by mouth 3 (three) times daily as needed for itching.  30 capsule  0  . metaxalone (SKELAXIN) 800 MG tablet Take 800 mg by mouth as directed.        . pregabalin (LYRICA) 100 MG capsule Take one tablet in AM and 1.5 tabs in PM        Allergies Escitalopram oxalate and Topiramate  Family History: Family History  Problem Relation Age of Onset  . Hypertension Father   . Deep vein thrombosis Father   . Pulmonary embolism Father   . Hypertension Mother   . Kidney failure Mother     transitory/iatrogenic  . Liver disease Mother     hepatic insufficiency  . OCD Mother   . Kidney disease Mother     renal failure  . Peripheral vascular disease Brother   . Clotting disorder Paternal Grandfather   . Pulmonary embolism Paternal Grandfather   . Bipolar disorder      sister & bro  . Depression      sister & bro  . Heart attack Maternal Grandmother     in 78s  . Mental illness Other     bipolar disorder  . Depression Other     Social History: History   Social History  . Marital Status: Married    Spouse Name: N/A    Number of Children: N/A  . Years of Education: N/A  Occupational History  . Not on file.   Social History Main Topics  . Smoking status: Former Smoker    Quit date: 12/10/1998  . Smokeless tobacco: Not on file  . Alcohol Use: Yes     rarely  . Drug Use: No  . Sexually Active: Yes   Other Topics Concern  . Not on file   Social History Narrative  . No narrative on file    Electrocardiogram:  Assessment and Plan

## 2011-08-07 ENCOUNTER — Ambulatory Visit: Payer: BC Managed Care – PPO | Admitting: Cardiology

## 2011-08-07 ENCOUNTER — Encounter: Payer: Self-pay | Admitting: Cardiology

## 2011-08-07 VITALS — BP 120/82 | HR 82 | Ht 63.0 in | Wt 139.0 lb

## 2011-08-07 DIAGNOSIS — I493 Ventricular premature depolarization: Secondary | ICD-10-CM

## 2011-08-07 NOTE — Progress Notes (Signed)
Exercise Treadmill Test  Pre-Exercise Testing Evaluation Rhythm: normal sinus  Rate: 82   PR:  .16 QRS:  .10  QT:  .38 QTc: .38   P axis: +60 degrees  QRS axis:  +45 degrees  ST Segments:  no significant ST changes at rest     Test  Exercise Tolerance Test Ordering MD: Marca Ancona, MD  Interpreting MD:  Marca Ancona, MD  Unique Test No: 1  Treadmill:  1  Indication for ETT: PVCs  Contraindication to ETT: No   Stress Modality: exercise - treadmill  Cardiac Imaging Performed: non   Protocol: standard Bruce - maximal  Max BP:  160/80  Max MPHR (bpm): 175 85% MPR (bpm):  148  MPHR obtained (bpm):  169 % MPHR obtained:  97%  Reached 85% MPHR (min:sec):  6:00 Total Exercise Time (min-sec):  9:54  Workload in METS:  12.9 Borg Scale: 17  Reason ETT Terminated:  Calf tightness, no chest pain.     ST Segment Analysis At Rest: normal ST segments - no evidence of significant ST depression With Exercise: no evidence of significant ST depression  Other Information Arrhythmia:  No Angina during ETT:  absent (0) Quality of ETT:  diagnostic  ETT Interpretation:  normal - no evidence of ischemia by ST analysis  Occasional PVCs at rest, no PVCs noted with exercise or during recovery.   Marca Ancona 08/07/2011 11:38 AM

## 2011-08-08 ENCOUNTER — Encounter (INDEPENDENT_AMBULATORY_CARE_PROVIDER_SITE_OTHER): Payer: BC Managed Care – PPO

## 2011-08-08 ENCOUNTER — Ambulatory Visit (HOSPITAL_COMMUNITY): Payer: BC Managed Care – PPO | Attending: Internal Medicine | Admitting: Radiology

## 2011-08-08 DIAGNOSIS — I493 Ventricular premature depolarization: Secondary | ICD-10-CM

## 2011-08-08 DIAGNOSIS — I4949 Other premature depolarization: Secondary | ICD-10-CM

## 2011-08-08 DIAGNOSIS — R002 Palpitations: Secondary | ICD-10-CM

## 2011-08-13 ENCOUNTER — Telehealth: Payer: Self-pay | Admitting: Internal Medicine

## 2011-08-13 NOTE — Telephone Encounter (Signed)
Dr.Hopper please advise, requested med is for itching, is this a medication to be used long term?

## 2011-08-13 NOTE — Telephone Encounter (Signed)
Hydroxyzine 25 mg one half to one every 8 hours as needed for itching ,dispense 21. Caution :May cause drowsiness or affect balance

## 2011-08-15 ENCOUNTER — Telehealth: Payer: Self-pay | Admitting: *Deleted

## 2011-08-15 NOTE — Telephone Encounter (Signed)
LMTCB RE STRESS RESULTS./CY

## 2011-08-15 NOTE — Telephone Encounter (Signed)
PT  AWARE OF  STRESS RESULTS./CY 

## 2011-08-15 NOTE — Telephone Encounter (Signed)
FU Call: Pt returning call to [facility]. Pt said it is ok for nurse to leave a msg on pt phone. Please return pt call to discuss further.

## 2011-08-19 ENCOUNTER — Encounter: Payer: BC Managed Care – PPO | Admitting: Physician Assistant

## 2011-08-20 ENCOUNTER — Other Ambulatory Visit: Payer: Self-pay | Admitting: Internal Medicine

## 2011-08-20 MED ORDER — HYDROXYZINE PAMOATE 25 MG PO CAPS: ORAL_CAPSULE | ORAL | Status: DC

## 2011-08-20 NOTE — Telephone Encounter (Signed)
RX called in .

## 2011-08-21 ENCOUNTER — Telehealth: Payer: Self-pay | Admitting: Cardiovascular Disease

## 2011-08-21 NOTE — Telephone Encounter (Signed)
New Problem:     Patient called in to cancel her appointment tomorrow with Dr. Eden Emms because all of her test came back normal and would like someone to give her a call back to see if she needs to follow up and come back in for an OV. Please call back.

## 2011-08-21 NOTE — Telephone Encounter (Signed)
Pt aware that holter results are still pending. She would prefer not to follow-up at this time since all of her other testing has come back normal.  Advised her that we would advise Dr Alwyn Ren of her request and would advise on holter once results are reported.

## 2011-08-22 ENCOUNTER — Ambulatory Visit: Payer: BC Managed Care – PPO | Admitting: Cardiovascular Disease

## 2011-08-26 ENCOUNTER — Telehealth: Payer: Self-pay | Admitting: *Deleted

## 2011-08-26 NOTE — Telephone Encounter (Signed)
Fu call °Patient returning your call °

## 2011-08-26 NOTE — Telephone Encounter (Signed)
PT  AWARE OF  MONITOR RESULTS ./CY 

## 2011-08-26 NOTE — Telephone Encounter (Signed)
Fu call Patient called back again she said you can leave a message

## 2011-08-26 NOTE — Telephone Encounter (Signed)
LEFT MESSAGE RE MONITOR RESULTS PER DR NISHAN  SR WITH OCC PVC'S./CY

## 2011-08-27 ENCOUNTER — Ambulatory Visit: Payer: BC Managed Care – PPO | Admitting: Cardiovascular Disease

## 2011-09-01 ENCOUNTER — Telehealth: Payer: Self-pay | Admitting: Cardiovascular Disease

## 2011-09-01 NOTE — Telephone Encounter (Signed)
NOTE MESSAGE ON HOLTER REPORT .Nicole Dunn

## 2011-09-01 NOTE — Telephone Encounter (Signed)
Fu call °Patient returning your call °

## 2011-12-15 ENCOUNTER — Telehealth: Payer: Self-pay | Admitting: Internal Medicine

## 2011-12-15 NOTE — Telephone Encounter (Signed)
Dr.Hopper please advise on refill request, last filled Jan 2013 for #21

## 2011-12-15 NOTE — Telephone Encounter (Signed)
OK x 1 

## 2011-12-15 NOTE — Telephone Encounter (Signed)
Refill: Hydroxyzine pam 25mg  vit. Take 1 capsule by mouth every 8 hours if needed for itching. Qty 21. Last fill 08-21-11

## 2011-12-16 MED ORDER — HYDROXYZINE PAMOATE 25 MG PO CAPS: ORAL_CAPSULE | ORAL | Status: DC

## 2011-12-16 NOTE — Telephone Encounter (Signed)
RX sent

## 2012-01-17 ENCOUNTER — Other Ambulatory Visit: Payer: Self-pay | Admitting: Internal Medicine

## 2012-03-01 ENCOUNTER — Ambulatory Visit (INDEPENDENT_AMBULATORY_CARE_PROVIDER_SITE_OTHER): Payer: BC Managed Care – PPO | Admitting: Internal Medicine

## 2012-03-01 ENCOUNTER — Encounter: Payer: Self-pay | Admitting: Internal Medicine

## 2012-03-01 VITALS — BP 126/80 | HR 103 | Temp 98.3°F | Resp 12 | Ht 63.08 in | Wt 141.6 lb

## 2012-03-01 DIAGNOSIS — Z0000 Encounter for general adult medical examination without abnormal findings: Secondary | ICD-10-CM

## 2012-03-01 MED ORDER — BUPROPION HCL ER (XL) 300 MG PO TB24: 300.0000 mg | ORAL_TABLET | ORAL | Status: DC

## 2012-03-01 MED ORDER — HYDROXYZINE PAMOATE 25 MG PO CAPS: ORAL_CAPSULE | ORAL | Status: DC

## 2012-03-01 NOTE — Progress Notes (Signed)
Subjective:    Patient ID: Nicole Dunn, female    DOB: 1966-03-13, 46 y.o.   MRN: [id number]  HPI  Nicole Dunn is here for a physical;acute issues include ongoing cervical radiculopathy and residual foot pain.      Review of Systems She is otherwise asymptomatic. Her chart was reviewed. During the evaluation of her PVCs in December 2012 she was found to be mildly anemic with hematocrit of 36.2. Her lowest hematocrit was 30.4 on 05/21/11, postop. Her last lipids were in 2011; TG were 176; HDL 58.9 and LDL 121.7. She has intermittent mild reflux symptoms. She denies dysphagia, abdominal pain, melena, or rectal bleeding. She has no bleeding dyscrasias such as epistaxis, hemoptysis, hematuria, or abnormal bruising or bleeding. She will have extensive labs performed at her place of employment in the fall     Objective:   Physical Exam Gen.: Thin but healthy and well-nourished in appearance. Alert, appropriate and cooperative throughout exam. Head: Normocephalic without obvious abnormalities;  Hair very fine  Eyes: No corneal or conjunctival inflammation noted. Pupils equal round reactive to light and accommodation. Fundal exam is benign without hemorrhages, exudate, papilledema. Extraocular motion intact. Vision grossly normal. Ears: External  ear exam reveals no significant lesions or deformities. Canals clear .TMs normal. Hearing is grossly normal bilaterally. Nose: External nasal exam reveals no deformity or inflammation. Nasal mucosa are pink and moist. No lesions or exudates noted.   Mouth: Oral mucosa and oropharynx reveal no lesions or exudates. Teeth in good repair. Neck: No deformities, masses, or tenderness noted. Range of motion & Thyroid normal. Lungs: Normal respiratory effort; chest expands symmetrically. Lungs are clear to auscultation without rales, wheezes, or increased work of breathing. Heart: Normal rate and rhythm. Normal S1 and S2. No gallop, click, or rub. S4 w/o murmur. Abdomen:  Bowel sounds normal; abdomen soft and nontender. No masses, organomegaly or hernias noted.Aorta palpable ; no AAA  Genitalia: Dr Ernestina Penna                                                          Musculoskeletal/extremities: Minor lordosis noted of  the thoracic  spine. No clubbing, cyanosis, edema, or deformity noted. Range of motion  normal .Tone & strength  normal.Joints normal. Nail health  good. Vascular: Carotid, radial artery, dorsalis pedis and  posterior tibial pulses are full and equal. No bruits present. Neurologic: Alert and oriented x3. Deep tendon reflexes symmetrical and normal.          Skin: Intact without suspicious lesions or rashes. Lymph: No cervical, axillary lymphadenopathy present. Psych: Mood and affect are normal. Normally interactive                                                                                         Assessment & Plan:  #1 comprehensive physical exam; no acute findings #2 see Problem List with Assessments & Recommendations Plan: see Orders .It would be advisable to @ least recheck fasting lipids,  hepatic panel, and CBC and differential as part of the Lab panels in the Fall

## 2012-03-01 NOTE — Patient Instructions (Addendum)
Preventive Health Care: Exercise  30-45  minutes a day, 3-4 days a week. Walking is especially valuable in preventing Osteoporosis. The most common cause of elevated triglycerides is the ingestion of sugar from high fructose corn syrup sources added to processed foods & drinks.  Eat a low-fat diet with lots of fruits and vegetables, up to 7-9 servings per day. Consume less than 30 (PREFERABLY ZERO) grams of sugar per day from foods & drinks with High Fructose Corn Syrup (HFCS) sugar as #1,2,3 or # 4 on label.Whole [employer], [employer] & [employer] do not carry products with HFCS. Please try to go on My Chart within the next 24 hours to allow direct access to your records

## 2012-03-11 ENCOUNTER — Ambulatory Visit: Payer: Self-pay | Admitting: General Practice

## 2012-09-28 ENCOUNTER — Other Ambulatory Visit: Payer: Self-pay | Admitting: Internal Medicine

## 2012-12-15 ENCOUNTER — Other Ambulatory Visit: Payer: Self-pay | Admitting: Internal Medicine

## 2012-12-15 NOTE — Telephone Encounter (Signed)
She does not have hypertension; it is hydroxyzine; renew as previously written

## 2012-12-15 NOTE — Telephone Encounter (Signed)
Hopp please advise, this medication is for blood pressure and I think the instructions for medication goes with similar spelled medication Hydralizine for itching

## 2013-02-01 ENCOUNTER — Other Ambulatory Visit: Payer: Self-pay | Admitting: Internal Medicine

## 2013-02-08 LAB — HM PAP SMEAR

## 2013-03-21 ENCOUNTER — Other Ambulatory Visit: Payer: Self-pay

## 2013-03-21 DIAGNOSIS — Z1231 Encounter for screening mammogram for malignant neoplasm of breast: Secondary | ICD-10-CM

## 2013-03-23 ENCOUNTER — Ambulatory Visit (INDEPENDENT_AMBULATORY_CARE_PROVIDER_SITE_OTHER): Payer: BC Managed Care – PPO | Admitting: Internal Medicine

## 2013-03-23 ENCOUNTER — Encounter: Payer: Self-pay | Admitting: Internal Medicine

## 2013-03-23 VITALS — BP 120/78 | HR 98 | Temp 98.2°F | Resp 12 | Ht 63.08 in | Wt 144.0 lb

## 2013-03-23 DIAGNOSIS — G43009 Migraine without aura, not intractable, without status migrainosus: Secondary | ICD-10-CM

## 2013-03-23 DIAGNOSIS — Z0000 Encounter for general adult medical examination without abnormal findings: Secondary | ICD-10-CM

## 2013-03-23 MED ORDER — BUPROPION HCL ER (XL) 300 MG PO TB24: ORAL_TABLET | ORAL | Status: DC

## 2013-03-23 MED ORDER — SUMATRIPTAN SUCCINATE 50 MG PO TABS: ORAL_TABLET | ORAL | Status: DC

## 2013-03-23 NOTE — Patient Instructions (Addendum)
Order for labs entered into  the computer; these will be performed at [address] [facility]. across from [facility]. No appointment is necessary.  Please keep a diary of your headaches .  Document  each occurrence on the calendar with notation of : #1 any prodrome ( any non headache symptom such as marked fatigue,visual changes, ,etc ) which precedes actual headache  #2) severity on 1-10 scale  #3) any triggers ( food/ drink,enviromenntal or weather changes ,physical or emotional stress) in 8-12 hour period prior to the headache  #4) response to any medications or other intervention. Please review "Headache" @ [facility] for additional information.     If you activate the  My Chart system; lab & Xray results will be released directly  to you as soon as I review & address these through the computer. If you choose not to sign up for My Chart within 36 hours of labs being drawn; results will be reviewed & interpretation added before being copied & mailed, causing a delay in getting the results to you.If you do not receive that report within 7-10 days ,please call. Additionally you can use this system to gain direct  access to your records  if  out of town or @ an office of a  physician who is not in  the My Chart network.  This improves continuity of care & places you in control of your medical record.

## 2013-03-23 NOTE — Progress Notes (Signed)
  Subjective:    Patient ID: Nicole Dunn, female    DOB: 10-11-1965, 47 y.o.   MRN: [id number]  HPI  She is here for a physical;acute issues include chronic daily headaches.     Review of Systems She has had the chronic low-grade headaches daily, typically level 3 on 10 scale for 5-6 years. She has a history of common migraine with visual prodrome for which she takes Excedrin Migraine, on average 2 X/week. She denies excessive intake of nonsteroidals or caffeine. She has been intolerant to generic Topamax. She is on gabapentin for cervical and lumbar radiculopathy with good response.     Objective:   Physical Exam  Gen.: Thin but healthy and well-nourished in appearance. Alert, appropriate and cooperative throughout exam.  Head: Normocephalic without obvious abnormalities.  Hair fine  Eyes: No corneal or conjunctival inflammation noted.  Extraocular motion intact. Vision grossly normal without lenses. FOV WNL. Ears: External  ear exam reveals no significant lesions or deformities. Canals clear .TMs normal. Hearing is grossly normal bilaterally. Nose: External nasal exam reveals no deformity or inflammation. Nasal mucosa are pink and moist. No lesions or exudates noted.   Mouth: Oral mucosa and oropharynx reveal no lesions or exudates. Teeth in good repair. Neck: No deformities, masses, or tenderness noted. Range of motion decreased for flexion. Thyroid normal. Lungs: Normal respiratory effort; chest expands symmetrically. Lungs are clear to auscultation without rales, wheezes, or increased work of breathing. Heart: Normal rate and rhythm. Normal S1 and S2. No gallop, click, or rub. No murmur.S4 with faint, brisk slurring at  LSB Abdomen: Bowel sounds normal; abdomen soft and nontender. No masses, organomegaly or hernias noted. Genitalia: As per Gyn                                  Musculoskeletal/extremities: No deformity or scoliosis noted of  the thoracic or lumbar spine.  No clubbing,  cyanosis, edema, or significant extremity  deformity noted. Range of motion normal .Tone & strength  Normal. Joints normal . Nail health good. Able to lie down & sit up w/o help. Negative SLR bilaterally to 90 + degrees. Vascular: Carotid, radial artery, dorsalis pedis and  posterior tibial pulses are full and equal. No bruits present. Neurologic: Alert and oriented x3. Deep tendon reflexes symmetrical and normal.     Skin: Intact without suspicious lesions or rashes. Lymph: No cervical, axillary lymphadenopathy present. Psych: Mood and affect are normal. Normally interactive                                                                                        Assessment & Plan:  #1 comprehensive physical exam; no acute findings #2 chronic daily headaches; Topamax not an option. They may be typically low-grade because of the gabapentin she takes as maintenance for cervical and lumbar radiculopathy. Trial of low-dose Imitrex recommended  Plan: see Orders  & Recommendations

## 2013-03-28 ENCOUNTER — Telehealth: Payer: Self-pay | Admitting: Internal Medicine

## 2013-03-28 NOTE — Telephone Encounter (Signed)
Patient is calling with questions about her Imitrex medication. Please advise.

## 2013-03-29 NOTE — Telephone Encounter (Signed)
Advised patient of recommendations. States will have to look at finances due to high deductible on insurance. Not sure if she can afford to go at this point.

## 2013-03-29 NOTE — Telephone Encounter (Signed)
Imitrex was prescribed to see if there were a migraine component to her chronic daily headaches. As it is not working; it should be discontinued. I recommend she discuss the headaches with the neurologist she is seeing or we can make referral to one of the headache clinics if desired.

## 2013-03-29 NOTE — Telephone Encounter (Signed)
LVM for patient to return call. 

## 2013-03-29 NOTE — Telephone Encounter (Signed)
Spoke with patient, she states that she had a headache through the weekend and it never went away with the Imitrex. When questioned, patient states that she took 1-2 everyday since her appt on the 13th. Patient has taken all 10. Discussed with patient how she was to take Imitrex and that taking in excessive amounts can lead to rebound headaches as with any headache medication.  Advise please.

## 2013-04-04 ENCOUNTER — Ambulatory Visit: Payer: BC Managed Care – PPO

## 2013-04-04 ENCOUNTER — Telehealth: Payer: Self-pay | Admitting: Internal Medicine

## 2013-04-04 NOTE — Telephone Encounter (Signed)
No documentation of discussing a steroid in the notes from 8.13. Please advise     KP

## 2013-04-04 NOTE — Telephone Encounter (Signed)
Patient Information:  Caller Name: Elbert  Phone: [phone]  Patient: Nicole Dunn  Gender: Female  DOB: [birth year]  Age: 47 Years  PCP: Marga Melnick  Pregnant: No  Office Follow Up:  Does the office need to follow up with this patient?: Yes  Instructions For The Office: RX request  RN Note:  Wants to try Steroids that Dr. Alwyn Ren mentioned on 03/23/13 visit per patient.  Pharmacy  [facility].  Advised message to be sent to office per pateint request  Symptoms  Reason For Call & Symptoms: Patient was seen by Dr. Alwyn Ren 03/23/13 for Headaches . She was given Imitrex which did not help. She spoke with staff and it was discussed Headache clinic or neurology follow up. She states she cannot afford this. Describes "whole head" pain as constant/dull and sharp". She states that Dr. Alwyn Ren mentioned trying steroids and she would like to try this  Reviewed Health History In EMR: Yes  Reviewed Medications In EMR: Yes  Reviewed Allergies In EMR: Yes  Reviewed Surgeries / Procedures: Yes  Date of Onset of Symptoms: 03/23/2013  Treatments Tried: Imitrex, excedrin migrain, vicodin , ibuprofen  Treatments Tried Worked: No OB / GYN:  LMP: Unknown  Guideline(s) Used:  Headache  Disposition Per Guideline:   See Today or Tomorrow in Office  Reason For Disposition Reached:   Unexplained headache that is present > 24 hours  Advice Given:  Rest:   Lie down in a dark, quiet place and try to relax. Close your eyes and imagine your entire body relaxing.  Apply Cold to the Area:   Apply a cold wet washcloth or cold pack to the forehead for 20 minutes.  Call Back If:  Headache lasts longer than 24 hours  You become worse.  RN Overrode Recommendation:  Patient Requests Prescription  Patient asking for steroiid Rx discussed with Dr. Alwyn Ren on 03/23/13 visit

## 2013-04-04 NOTE — Telephone Encounter (Signed)
Patient has a neurologist and declined an apt. She said thanks for trying.       KP

## 2013-04-04 NOTE — Telephone Encounter (Signed)
Hopp did not mention steroids in his note , only neurology referral.  I'm happy to put referral in but don't feel comfortable about writing steroids.

## 2013-04-13 ENCOUNTER — Other Ambulatory Visit (INDEPENDENT_AMBULATORY_CARE_PROVIDER_SITE_OTHER): Payer: BC Managed Care – PPO

## 2013-04-13 DIAGNOSIS — Z0000 Encounter for general adult medical examination without abnormal findings: Secondary | ICD-10-CM

## 2013-04-13 LAB — CBC WITH DIFFERENTIAL/PLATELET
Basophils Relative: 0.2 % (ref 0.0–3.0)
Eosinophils Relative: 1 % (ref 0.0–5.0)
Lymphocytes Relative: 23.4 % (ref 12.0–46.0)
MCV: 92.2 fl (ref 78.0–100.0)
Monocytes Relative: 5.1 % (ref 3.0–12.0)
Neutrophils Relative %: 70.3 % (ref 43.0–77.0)
RBC: 4.4 Mil/uL (ref 3.87–5.11)
WBC: 4.7 10*3/uL (ref 4.5–10.5)

## 2013-04-13 LAB — TSH: TSH: 1.93 u[IU]/mL (ref 0.35–5.50)

## 2013-04-13 LAB — BASIC METABOLIC PANEL
Calcium: 9.2 mg/dL (ref 8.4–10.5)
Chloride: 107 mEq/L (ref 96–112)
Creatinine, Ser: 0.9 mg/dL (ref 0.4–1.2)

## 2013-04-13 LAB — HEPATIC FUNCTION PANEL
ALT: 20 U/L (ref 0–35)
AST: 17 U/L (ref 0–37)
Alkaline Phosphatase: 46 U/L (ref 39–117)
Bilirubin, Direct: 0.1 mg/dL (ref 0.0–0.3)
Total Protein: 7.4 g/dL (ref 6.0–8.3)

## 2013-04-13 LAB — LIPID PANEL: Total CHOL/HDL Ratio: 3

## 2013-04-14 ENCOUNTER — Encounter: Payer: Self-pay | Admitting: *Deleted

## 2013-04-20 ENCOUNTER — Other Ambulatory Visit: Payer: Self-pay | Admitting: Internal Medicine

## 2013-04-21 NOTE — Telephone Encounter (Signed)
rx refilled per protocol. DJR  

## 2013-04-27 ENCOUNTER — Ambulatory Visit
Admission: RE | Admit: 2013-04-27 | Discharge: 2013-04-27 | Disposition: A | Discharge status: Home / Self Care | Payer: BC Managed Care – PPO | Source: Ambulatory Visit

## 2013-04-27 DIAGNOSIS — Z1231 Encounter for screening mammogram for malignant neoplasm of breast: Secondary | ICD-10-CM

## 2013-04-28 ENCOUNTER — Other Ambulatory Visit: Payer: Self-pay | Admitting: Obstetrics and Gynecology

## 2013-04-28 DIAGNOSIS — R928 Other abnormal and inconclusive findings on diagnostic imaging of breast: Secondary | ICD-10-CM

## 2013-05-13 ENCOUNTER — Other Ambulatory Visit: Payer: BC Managed Care – PPO

## 2013-05-26 ENCOUNTER — Other Ambulatory Visit: Payer: Self-pay | Admitting: Obstetrics and Gynecology

## 2013-05-26 ENCOUNTER — Ambulatory Visit
Admission: RE | Admit: 2013-05-26 | Discharge: 2013-05-26 | Disposition: A | Discharge status: Home / Self Care | Payer: BC Managed Care – PPO | Source: Ambulatory Visit | Attending: Obstetrics and Gynecology | Admitting: Obstetrics and Gynecology

## 2013-05-26 DIAGNOSIS — R928 Other abnormal and inconclusive findings on diagnostic imaging of breast: Secondary | ICD-10-CM

## 2013-10-13 ENCOUNTER — Ambulatory Visit (INDEPENDENT_AMBULATORY_CARE_PROVIDER_SITE_OTHER): Payer: 59 | Admitting: Internal Medicine

## 2013-10-13 ENCOUNTER — Encounter: Payer: Self-pay | Admitting: Internal Medicine

## 2013-10-13 VITALS — BP 120/68 | HR 105 | Temp 97.9°F | Resp 12 | Wt 140.0 lb

## 2013-10-13 DIAGNOSIS — R030 Elevated blood-pressure reading, without diagnosis of hypertension: Secondary | ICD-10-CM | POA: Insufficient documentation

## 2013-10-13 MED ORDER — PROPRANOLOL HCL 10 MG PO TABS: ORAL_TABLET | ORAL | Status: DC

## 2013-10-13 NOTE — Progress Notes (Signed)
   Subjective:    Patient ID: Nicole Dunn, female    DOB: [birth year], 48 y.o.   MRN: [id number]  HPI Blood pressure range : 120/68-150/95 To date no  anti hypertemsive medication.  A heart healthy /low salt diet  Not followed. No exercise except walking @ work. Family history is negative premature MI or CVA but both parents & sister have HTN.        Review of Systems  Significant headaches a chronic issue; followed by Neurology.No  epistaxis, chest pain,  exertional dyspnea, claudication, paroxysmal nocturnal dyspnea, or edema absent.Occasional palpitations.      Objective:   Physical Exam Appears healthy and well-nourished & in no acute distress   Fundal exam is normal with no exudates or hemorrhages. No significant arteriolar  changes  No carotid bruits are present.No neck pain distention present at 10 - 15 degrees. Thyroid normal to palpation  Heart rhythm and rate are normal with no significant murmurs or gallops.  Chest is clear with no increased work of breathing  There is no evidence of aortic aneurysm or renal artery bruits  Abdomen soft with no organomegaly or masses. No HJR. Aorta is palpable; no aneurysm present  No clubbing, cyanosis or edema present.  Pedal pulses are intact . Dorsalis pedis pulses slightly difficult to palpate  No ischemic skin changes are present . Nails healthy Alert and oriented. Strength, tone, DTRs reflexes normal          Assessment & Plan:  See Current Assessment & Plan in Problem List under specific Diagnosis

## 2013-10-13 NOTE — Assessment & Plan Note (Signed)
Trial of propranolol prn

## 2013-10-13 NOTE — Progress Notes (Signed)
Pre visit review using our clinic review tool, if applicable. No additional management support is needed unless otherwise documented below in the visit note. 

## 2013-10-13 NOTE — Patient Instructions (Signed)

## 2013-11-28 ENCOUNTER — Other Ambulatory Visit: Payer: Self-pay

## 2013-11-28 MED ORDER — HYDROXYZINE PAMOATE 25 MG PO CAPS: ORAL_CAPSULE | ORAL | Status: DC

## 2013-11-30 ENCOUNTER — Telehealth: Payer: Self-pay | Admitting: Internal Medicine

## 2013-11-30 MED ORDER — BUPROPION HCL ER (XL) 300 MG PO TB24: ORAL_TABLET | ORAL | Status: DC

## 2013-11-30 NOTE — Telephone Encounter (Signed)
Patient called and requested a refill for buPROPion (WELLBUTRIN XL) 300 MG 24 hr tablet Pharmacy [facility] out patient pharmacy

## 2013-11-30 NOTE — Telephone Encounter (Signed)
OK X 3 mos 

## 2013-12-19 ENCOUNTER — Other Ambulatory Visit: Payer: Self-pay | Admitting: Internal Medicine

## 2014-02-21 ENCOUNTER — Other Ambulatory Visit: Payer: Self-pay | Admitting: Internal Medicine

## 2014-03-01 ENCOUNTER — Other Ambulatory Visit: Payer: Self-pay | Admitting: Internal Medicine

## 2014-03-01 NOTE — Telephone Encounter (Signed)
OK # 90 

## 2014-05-15 ENCOUNTER — Other Ambulatory Visit: Payer: Self-pay | Admitting: Internal Medicine

## 2014-05-18 ENCOUNTER — Ambulatory Visit
Admission: RE | Admit: 2014-05-18 | Discharge: 2014-05-18 | Disposition: A | Discharge status: Home / Self Care | Payer: 59 | Source: Ambulatory Visit

## 2014-05-18 ENCOUNTER — Other Ambulatory Visit: Payer: Self-pay

## 2014-05-18 DIAGNOSIS — Z1231 Encounter for screening mammogram for malignant neoplasm of breast: Secondary | ICD-10-CM

## 2014-05-23 ENCOUNTER — Other Ambulatory Visit: Payer: Self-pay | Admitting: Obstetrics and Gynecology

## 2014-05-23 DIAGNOSIS — R928 Other abnormal and inconclusive findings on diagnostic imaging of breast: Secondary | ICD-10-CM

## 2014-06-05 ENCOUNTER — Ambulatory Visit
Admission: RE | Admit: 2014-06-05 | Discharge: 2014-06-05 | Disposition: A | Discharge status: Home / Self Care | Payer: 59 | Source: Ambulatory Visit | Attending: Obstetrics and Gynecology | Admitting: Obstetrics and Gynecology

## 2014-06-05 DIAGNOSIS — R928 Other abnormal and inconclusive findings on diagnostic imaging of breast: Secondary | ICD-10-CM

## 2014-06-07 ENCOUNTER — Other Ambulatory Visit: Payer: Self-pay | Admitting: Family Medicine

## 2014-06-12 ENCOUNTER — Encounter: Payer: Self-pay | Admitting: Internal Medicine

## 2014-07-17 ENCOUNTER — Ambulatory Visit (INDEPENDENT_AMBULATORY_CARE_PROVIDER_SITE_OTHER): Payer: 59 | Admitting: Family

## 2014-07-17 ENCOUNTER — Other Ambulatory Visit (INDEPENDENT_AMBULATORY_CARE_PROVIDER_SITE_OTHER): Payer: 59

## 2014-07-17 ENCOUNTER — Encounter: Payer: Self-pay | Admitting: Family

## 2014-07-17 ENCOUNTER — Telehealth: Payer: Self-pay | Admitting: Family

## 2014-07-17 VITALS — BP 112/74 | HR 68 | Temp 98.2°F | Resp 18 | Ht 63.0 in | Wt 148.1 lb

## 2014-07-17 DIAGNOSIS — R1031 Right lower quadrant pain: Secondary | ICD-10-CM | POA: Insufficient documentation

## 2014-07-17 DIAGNOSIS — M5440 Lumbago with sciatica, unspecified side: Secondary | ICD-10-CM

## 2014-07-17 DIAGNOSIS — R062 Wheezing: Secondary | ICD-10-CM

## 2014-07-17 LAB — CBC
HEMATOCRIT: 38.3 % (ref 36.0–46.0)
Hemoglobin: 12.8 g/dL (ref 12.0–15.0)
MCHC: 33.5 g/dL (ref 30.0–36.0)
MCV: 93.2 fl (ref 78.0–100.0)
Platelets: 221 10*3/uL (ref 150.0–400.0)
RBC: 4.11 Mil/uL (ref 3.87–5.11)
RDW: 12.6 % (ref 11.5–15.5)
WBC: 5.7 10*3/uL (ref 4.0–10.5)

## 2014-07-17 MED ORDER — ALBUTEROL SULFATE (2.5 MG/3ML) 0.083% IN NEBU: 2.5000 mg | INHALATION_SOLUTION | Freq: Once | RESPIRATORY_TRACT | Status: DC

## 2014-07-17 NOTE — Progress Notes (Signed)
Pre visit review using our clinic review tool, if applicable. No additional management support is needed unless otherwise documented below in the visit note. 

## 2014-07-17 NOTE — Assessment & Plan Note (Signed)
Right lower quadrant pain with concern for her appendicitis. Obtain stat CT of the abdomen with contrast. Instructed patient if signs and symptoms worse and to seek emergency medical care prior to CT completion. Follow up pending CT results.

## 2014-07-17 NOTE — Telephone Encounter (Signed)
Please call patient informed her that her lab work was normal. We'll  back in touch pending her CT scan.

## 2014-07-17 NOTE — Patient Instructions (Addendum)
Thank you for choosing [facility].  Summary/Instructions:  Please stop by the lab on the basement level of the building for your blood work. Your results will be released to [place] (or called to you) after review, usually within 72hours after test completion. If any changes need to be made, you will be notified at that same time.  Please stop by radiology on the basement level of the building for your x-rays. Your results will be released to [place] (or called to you) after review, usually within 72hours after test completion. If any changes need to be made, you will be notified at that same time.  If your symptoms worsen or fail to improve, please contact our office for further instruction, or in case of emergency go directly to the emergency room at the closest medical facility.   Appendicitis Appendicitis is when the appendix is swollen (inflamed). The inflammation can lead to developing a hole (perforation) and a collection of pus (abscess). CAUSES  There is not always an obvious cause of appendicitis. Sometimes it is caused by an obstruction in the appendix. The obstruction can be caused by:  A small, hard, pea-sized ball of stool (fecalith).  Enlarged lymph glands in the appendix. SYMPTOMS   Pain around your belly button (navel) that moves toward your lower right belly (abdomen). The pain can become more severe and sharp as time passes.  Tenderness in the lower right abdomen. Pain gets worse if you cough or make a sudden movement.  Feeling sick to your stomach (nauseous).  Throwing up (vomiting).  Loss of appetite.  Fever.  Constipation.  Diarrhea.  Generally not feeling well. DIAGNOSIS   Physical exam.  Blood tests.  Urine test.  X-rays or a CT scan may confirm the diagnosis. TREATMENT  Once the diagnosis of appendicitis is made, the most common treatment is to remove the appendix as soon as possible. This procedure is called appendectomy. In an open  appendectomy, a cut (incision) is made in the lower right abdomen and the appendix is removed. In a laparoscopic appendectomy, usually 3 small incisions are made. Long, thin instruments and a camera tube are used to remove the appendix. Most patients go home in 24 to 48 hours after appendectomy. In some situations, the appendix may have already perforated and an abscess may have formed. The abscess may have a "wall" around it as seen on a CT scan. In this case, a drain may be placed into the abscess to remove fluid, and you may be treated with antibiotic medicines that kill germs. The medicine is given through a tube in your vein (IV). Once the abscess has resolved, it may or may not be necessary to have an appendectomy. You may need to stay in the hospital longer than 48 hours. Document Released: 07/28/2005 Document Revised: 01/27/2012 Document Reviewed: 10/23/2009 [facility] Patient Information 2015 [place], [place]. This information is not intended to replace advice given to you by your health care provider. Make sure you discuss any questions you have with your health care provider.

## 2014-07-17 NOTE — Assessment & Plan Note (Signed)
Pain consistent with lumbar disc pathology. Discussed options of steroid with patient. Will wait till CT is completed for abdomen exam before starting steroids if necessary. Discussed referral to orthopedics or neurosurgery for further management.

## 2014-07-17 NOTE — Progress Notes (Signed)
Subjective:    Patient ID: Nicole Dunn, female    DOB: 1966-03-07, 48 y.o.   MRN: [id number]  Chief Complaint  Patient presents with  . Abdominal Pain    very bad lower stomach pain that started last wednesday said it feels like "ground glass" also bad lower back pain that goes to both hips    HPI:  Nicole Dunn is a 48 y.o. female who presents today for an acute visit.  1) Acute symptoms started about 5 days ago and describes the feeling of a "belly full of broken glass." States that she has taken Flexeril, Baclofen and robaxin and the pain eased up. Currently experencing a similar feeling down from an original intensity of 8/10 down to 2/10. Heating pad also helps. Denies any fevers, changes in bowel or bladder, constipation or diarrhea. Has previously had a hysterectomy but still has her ovaries.   2) L5-S1 Bulging disc - has radiating pain down the back of both legs. Has previously been placed on steroids which seemed to help.   Allergies  Allergen Reactions  . Escitalopram Oxalate     Sexual dysfunction  . Topiramate     Mood change, anger   Current Outpatient Prescriptions on File Prior to Visit  Medication Sig Dispense Refill  . AMBULATORY NON FORMULARY MEDICATION Topical Cream: ? Name (Rx;ed by Neurologist)    . buPROPion (WELLBUTRIN XL) 300 MG 24 hr tablet TAKE 1 TABLET BY MOUTH EVERY MORNING (DUE FOR OFFICE VISIT) 90 tablet PRN  . cyclobenzaprine (FLEXERIL) 10 MG tablet Take 10 mg by mouth 3 (three) times daily as needed.    . gabapentin (NEURONTIN) 300 MG capsule Take 300 mg by mouth 3 (three) times daily.    . Hydrocodone-Acetaminophen (VICODIN ES PO) Take by mouth. 7.5-325  1 by mouth every 8 hours as needed, rx'ed by Dr.Dalton-Bethea    . hydrOXYzine (VISTARIL) 25 MG capsule TAKE 1 CAPSULE BY MOUTH EVERY 8 HOURS AS NEEDED (MAY CAUSE DROWSINESS AND AFFECT YOUR BALANCE) 60 capsule 1  . propranolol (INDERAL) 10 MG tablet TAKE 1 TABLET BY MOUTH EVERY 8-12 HOURS AS NEEDED  90 tablet 5   No current facility-administered medications on file prior to visit.    Review of Systems     See HPI  Objective:    BP 112/74 mmHg  Pulse 68  Temp(Src) 98.2 F (36.8 C) (Oral)  Resp 18  Ht 5\' 3"  (1.6 m)  Wt 148 lb 1.9 oz (67.187 kg)  BMI 26.24 kg/m2  SpO2 99% Nursing note and vital signs reviewed.  Physical Exam  Constitutional: She is oriented to person, place, and time. She appears well-developed and well-nourished. No distress.  Cardiovascular: Normal rate, regular rhythm, normal heart sounds and intact distal pulses.   Pulmonary/Chest: Effort normal and breath sounds normal.  Abdominal: Soft. Bowel sounds are normal. She exhibits no distension, no abdominal bruit, no ascites and no mass. There is tenderness in the right lower quadrant and suprapubic area. There is tenderness at McBurney's point (Pain noted at [facility]). There is no rebound, no guarding, no CVA tenderness and negative Murphy's sign.  Musculoskeletal:  No obvious deformity, discoloration, or edema noted of the lower back. Straight leg raise increases radiculopathy in both legs. Tenderness located over her lumbar spine midline.  Neurological: She is alert and oriented to person, place, and time.  Skin: Skin is warm and dry.  Psychiatric: She has a normal mood and affect. Her behavior is normal. Judgment  and thought content normal.       Assessment & Plan:

## 2014-07-18 ENCOUNTER — Ambulatory Visit (INDEPENDENT_AMBULATORY_CARE_PROVIDER_SITE_OTHER)
Admission: RE | Admit: 2014-07-18 | Discharge: 2014-07-18 | Disposition: A | Discharge status: Home / Self Care | Payer: 59 | Source: Ambulatory Visit | Attending: Family | Admitting: Family

## 2014-07-18 DIAGNOSIS — R1031 Right lower quadrant pain: Secondary | ICD-10-CM

## 2014-07-18 NOTE — Telephone Encounter (Signed)
Please call patient to inform her that her CT scan was negative for appendicitis. It also does not give [place] any indication as to the pain she is experiencing. 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 If she continues to have the pain we can refer her to gastroenterology.

## 2014-07-19 NOTE — Telephone Encounter (Signed)
Pt aware.

## 2014-08-30 ENCOUNTER — Telehealth: Payer: Self-pay | Admitting: Internal Medicine

## 2014-08-30 MED ORDER — HYDROXYZINE PAMOATE 25 MG PO CAPS: ORAL_CAPSULE | ORAL | Status: DC

## 2014-08-30 NOTE — Telephone Encounter (Signed)
Done

## 2014-08-30 NOTE — Telephone Encounter (Signed)
Pt request refill for hydrOXYzine (VISTARIL) to be send to [facility] on [address].

## 2014-09-07 ENCOUNTER — Telehealth: Payer: Self-pay | Admitting: Internal Medicine

## 2014-09-07 MED ORDER — PROPRANOLOL HCL 10 MG PO TABS: ORAL_TABLET | ORAL | Status: DC

## 2014-09-07 NOTE — Telephone Encounter (Signed)
Sent refill to [facility]...Johny Chess

## 2014-09-07 NOTE — Telephone Encounter (Signed)
Pt called in requesting refill on her propranolol (INDERAL) 10 MG tablet [[id number]]      [facility]

## 2014-09-28 ENCOUNTER — Other Ambulatory Visit: Payer: Self-pay

## 2014-09-28 MED ORDER — PROPRANOLOL HCL 10 MG PO TABS: ORAL_TABLET | ORAL | Status: DC

## 2014-09-28 MED ORDER — BUPROPION HCL ER (XL) 300 MG PO TB24: ORAL_TABLET | ORAL | Status: DC

## 2014-09-28 NOTE — Telephone Encounter (Signed)
Refills okay 

## 2014-09-28 NOTE — Telephone Encounter (Signed)
OK X 3 mos but will need OV before additional refills

## 2014-12-07 ENCOUNTER — Other Ambulatory Visit: Payer: Self-pay | Admitting: Internal Medicine

## 2014-12-07 NOTE — Telephone Encounter (Signed)
OK X 3 mos OV before next trefill

## 2014-12-26 ENCOUNTER — Other Ambulatory Visit: Payer: Self-pay

## 2014-12-26 ENCOUNTER — Telehealth: Payer: Self-pay | Admitting: Internal Medicine

## 2014-12-26 MED ORDER — PROPRANOLOL HCL 10 MG PO TABS: ORAL_TABLET | ORAL | Status: DC

## 2014-12-26 NOTE — Telephone Encounter (Signed)
Patient requesting refill for propranolol (INDERAL) 10 MG tablet [[id number]. I did schedule an appointment for a physical on Tues 5/24. Pharmacy is [facility] on [address]

## 2014-12-26 NOTE — Telephone Encounter (Signed)
Advised patient that 30 day supply bp med has been called in to [facility] on [address], patient needs to keep already scheduled appt

## 2015-01-02 ENCOUNTER — Ambulatory Visit (INDEPENDENT_AMBULATORY_CARE_PROVIDER_SITE_OTHER): Payer: BLUE CROSS/BLUE SHIELD | Admitting: Internal Medicine

## 2015-01-02 ENCOUNTER — Encounter: Payer: Self-pay | Admitting: Internal Medicine

## 2015-01-02 VITALS — BP 112/78 | HR 87 | Temp 98.0°F | Wt 144.8 lb

## 2015-01-02 DIAGNOSIS — F411 Generalized anxiety disorder: Secondary | ICD-10-CM

## 2015-01-02 DIAGNOSIS — G43009 Migraine without aura, not intractable, without status migrainosus: Secondary | ICD-10-CM

## 2015-01-02 DIAGNOSIS — R0683 Snoring: Secondary | ICD-10-CM

## 2015-01-02 DIAGNOSIS — Z0000 Encounter for general adult medical examination without abnormal findings: Secondary | ICD-10-CM

## 2015-01-02 MED ORDER — PROPRANOLOL HCL 10 MG PO TABS: ORAL_TABLET | ORAL | Status: DC

## 2015-01-02 MED ORDER — BUPROPION HCL ER (XL) 300 MG PO TB24: ORAL_TABLET | ORAL | Status: DC

## 2015-01-02 NOTE — Progress Notes (Signed)
   Subjective:    Patient ID: Nicole Dunn, female    DOB: 1966-06-30, 49 y.o.   MRN: [id number]  HPI She is here for a physical;acute issues include possible sleep apnea. She states that her husband describes excessive snoring. Additionally she has intermittent awakening during the night with headaches.  She has been compliant with her medications without adverse effects. She's not on a heart healthy diet. She is physically active on her job without cardiopulmonary symptoms.  She questions whether Wellbutrin XL 300 mg is  effective for generalized anxiety disorder. She uses propranolol 10 mg as needed with good response. She will take up to 2 pills at times and typically will take 3-4 doses per week.She is seeing a Retail banker also.  She is on gabapentin 300 mg each morning and 2-3 at bedtime. Additionally occasionsionally she'll take an extra 300 during the day from her neurologic pain specialist. That doctor prescribes generic Vicodin also. She is also on Flexeril 10 mg as needed.   She smoked 1983-2000 up to 1 pack per week. She rarely drinks alcohol.  Hypertension is present in mother, father, and sister. Paternal grandfather had heart attack in his 15s. Mother had a stroke at 73. Her father died at 2 and grandfather 40 presumably of pulmonary emboli. Her brother had deep venous thrombosis; she has no clotting or bleeding dyscrasias.     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 She does have intermittent sores in the nares.  Chest pain, palpitations, tachycardia, exertional dyspnea, paroxysmal nocturnal dyspnea, claudication or edema are absent.  Unexplained weight loss, abdominal pain, significant dyspepsia, dysphagia, melena, rectal bleeding, or persistently small caliber stools are denied.    Objective:   Physical Exam  Pertinent or positive findings include: Dorsalis pedis pulses are decreased; she has no ischemic changes  in the feet.  She has slight crepitus of the knees.  General appearance :adequately nourished; in no distress. Eyes: No conjunctival inflammation or scleral icterus is present. Oral exam:  Lips and gums are healthy appearing.There is no oropharyngeal erythema or exudate noted. Dental hygiene is good. Heart:  Normal rate and regular rhythm. S1 and S2 normal without gallop, murmur, click, rub or other extra sounds   Lungs:Chest clear to auscultation; no wheezes, rhonchi,rales ,or rubs present.No increased work of breathing.  Abdomen: bowel sounds normal, soft and non-tender without masses, organomegaly or hernias noted.  No guarding or rebound. GU: as per Gyn Vascular : all pulses equal ; no bruits present. Skin:Warm & dry.  Intact without suspicious lesions or rashes ; no tenting or jaundice  Lymphatic: No lymphadenopathy is noted about the head, neck, axilla Neuro: Strength, tone & DTRs normal.        Assessment & Plan:  #1 comprehensive physical exam; no acute findings  Plan: see Orders  & Recommendations

## 2015-01-02 NOTE — Patient Instructions (Signed)
  Your next office appointment will be determined based upon review of your pending labs  .  Those written interpretation of the lab results and instructions will be transmitted to you by mail for your records.  Critical results will be called.  Followup as needed for any active or acute issue. Please report any significant change in your symptoms.  The Pulmonary referral will be scheduled and you'll be notified of the time.Please call the Referral Co-Ordinator @ [phone] if you have not been notified of appointment time within 7-10 days.

## 2015-01-02 NOTE — Progress Notes (Signed)
Pre visit review using our clinic review tool, if applicable. No additional management support is needed unless otherwise documented below in the visit note. 

## 2015-01-22 ENCOUNTER — Other Ambulatory Visit: Payer: Self-pay | Admitting: Internal Medicine

## 2015-01-22 NOTE — Telephone Encounter (Signed)
Propranolol rx sent to pharm

## 2015-01-24 ENCOUNTER — Other Ambulatory Visit: Payer: Self-pay | Admitting: Emergency Medicine

## 2015-01-25 ENCOUNTER — Other Ambulatory Visit: Payer: Self-pay | Admitting: Emergency Medicine

## 2015-01-25 MED ORDER — HYDROXYZINE PAMOATE 25 MG PO CAPS: ORAL_CAPSULE | ORAL | Status: DC

## 2015-01-29 ENCOUNTER — Other Ambulatory Visit (INDEPENDENT_AMBULATORY_CARE_PROVIDER_SITE_OTHER): Payer: BLUE CROSS/BLUE SHIELD

## 2015-01-29 DIAGNOSIS — Z0189 Encounter for other specified special examinations: Secondary | ICD-10-CM | POA: Diagnosis not present

## 2015-01-29 DIAGNOSIS — Z0000 Encounter for general adult medical examination without abnormal findings: Secondary | ICD-10-CM

## 2015-01-29 LAB — BASIC METABOLIC PANEL
BUN: 11 mg/dL (ref 6–23)
CALCIUM: 9.4 mg/dL (ref 8.4–10.5)
CO2: 25 mEq/L (ref 19–32)
Chloride: 108 mEq/L (ref 96–112)
Creatinine, Ser: 0.91 mg/dL (ref 0.40–1.20)
GFR: 69.94 mL/min (ref >60.00)
Glucose, Bld: 87 mg/dL (ref 70–99)
POTASSIUM: 3.9 meq/L (ref 3.5–5.1)
SODIUM: 139 meq/L (ref 135–145)

## 2015-01-29 LAB — HEPATIC FUNCTION PANEL
ALT: 34 U/L (ref 0–35)
AST: 26 U/L (ref 0–37)
Albumin: 4.3 g/dL (ref 3.5–5.2)
Alkaline Phosphatase: 49 U/L (ref 39–117)
Bilirubin, Direct: 0.1 mg/dL (ref 0.0–0.3)
TOTAL PROTEIN: 7 g/dL (ref 6.0–8.3)
Total Bilirubin: 0.6 mg/dL (ref 0.2–1.2)

## 2015-01-29 LAB — CBC WITH DIFFERENTIAL/PLATELET
Basophils Absolute: 0 10*3/uL (ref 0.0–0.1)
Basophils Relative: 0.3 % (ref 0.0–3.0)
Eosinophils Absolute: 0.1 10*3/uL (ref 0.0–0.7)
Eosinophils Relative: 1.8 % (ref 0.0–5.0)
HEMATOCRIT: 41.8 % (ref 36.0–46.0)
Hemoglobin: 14.3 g/dL (ref 12.0–15.0)
Lymphocytes Relative: 18 % (ref 12.0–46.0)
Lymphs Abs: 1 10*3/uL (ref 0.7–4.0)
MCHC: 34.1 g/dL (ref 30.0–36.0)
MCV: 94.3 fl (ref 78.0–100.0)
Monocytes Absolute: 0.3 10*3/uL (ref 0.1–1.0)
Monocytes Relative: 4.8 % (ref 3.0–12.0)
NEUTROS ABS: 4.2 10*3/uL (ref 1.4–7.7)
Neutrophils Relative %: 75.1 % (ref 43.0–77.0)
Platelets: 220 10*3/uL (ref 150.0–400.0)
RBC: 4.43 Mil/uL (ref 3.87–5.11)
RDW: 12.1 % (ref 11.5–15.5)
WBC: 5.6 10*3/uL (ref 4.0–10.5)

## 2015-01-29 LAB — LIPID PANEL
CHOL/HDL RATIO: 3
Cholesterol: 147 mg/dL (ref 0–200)
HDL: 48.4 mg/dL (ref >39.00)
LDL CALC: 76 mg/dL (ref 0–99)
NonHDL: 98.6
TRIGLYCERIDES: 115 mg/dL (ref 0.0–149.0)
VLDL: 23 mg/dL (ref 0.0–40.0)

## 2015-01-29 LAB — TSH: TSH: 1.74 u[IU]/mL (ref 0.35–4.50)

## 2015-03-06 ENCOUNTER — Other Ambulatory Visit: Payer: Self-pay | Admitting: Internal Medicine

## 2015-03-06 NOTE — Telephone Encounter (Signed)
Wellbutrin refill request sent to mail order

## 2015-04-09 ENCOUNTER — Ambulatory Visit (INDEPENDENT_AMBULATORY_CARE_PROVIDER_SITE_OTHER): Payer: BLUE CROSS/BLUE SHIELD | Admitting: Podiatry

## 2015-04-09 ENCOUNTER — Encounter: Payer: Self-pay | Admitting: Podiatry

## 2015-04-09 VITALS — BP 117/74 | HR 69 | Resp 16 | Ht 63.0 in | Wt 150.0 lb

## 2015-04-09 DIAGNOSIS — L600 Ingrowing nail: Secondary | ICD-10-CM

## 2015-04-09 NOTE — Progress Notes (Signed)
   Subjective:    Patient ID: Nicole Dunn, female    DOB: 1965/09/23, 49 y.o.   MRN: [id number]  HPI Patient presents with an ingrown toenail, Right foot, great toe, medial side. This has been going on for the past year.  Review of Systems  Musculoskeletal: Positive for myalgias, back pain and arthralgias.  Neurological: Positive for headaches.  All other systems reviewed and are negative.      Objective:   Physical Exam        Assessment & Plan:

## 2015-04-09 NOTE — Progress Notes (Signed)
Subjective:     Patient ID: Nicole Dunn, female   DOB: 26-Jan-1966, 49 y.o.   MRN: [id number]  HPI patient presents stating I have a painful ingrown toenail on my right big toe. States it's been going on for a year and she's tried to trim it and soak it without relief   Review of Systems  All other systems reviewed and are negative.      Objective:   Physical Exam  Constitutional: She is oriented to person, place, and time.  Cardiovascular: Intact distal pulses.   Musculoskeletal: Normal range of motion.  Neurological: She is oriented to person, place, and time.  Skin: Skin is warm.  Nursing note and vitals reviewed.  neurovascular status intact muscle strength adequate range of motion within normal limits. Patient's found have an incurvated right hallux that's painful on the medial border with distal redness and localized irritation of the tissue. No proximal edema erythema or drainage was noted     Assessment:     Ingrown toenail deformity right hallux medial border    Plan:     Reviewed condition and consideration for correction with patient. Reviewed risk and patient wants surgery and today I infiltrated the right hallux 60 mg Xylocaine Marcaine mixture remove medial border exposed matrix and applied phenol 3 applications 30 seconds followed by alcohol lavage and sterile dressing. Gave instructions on soaks and reappoint

## 2015-04-09 NOTE — Patient Instructions (Signed)

## 2015-04-10 ENCOUNTER — Telehealth: Payer: Self-pay | Admitting: *Deleted

## 2015-04-10 NOTE — Telephone Encounter (Signed)
Called patient at [phone] (Cell #) to ask how they were doing from their ingrown toenail procedure that was performed yesterday, April 09, 2015. Pt stated, "Toe hurt pretty bad during the night". Pt has taken ibuprofen and soaked toe with some relief.

## 2015-04-29 ENCOUNTER — Other Ambulatory Visit: Payer: Self-pay | Admitting: Internal Medicine

## 2015-04-30 NOTE — Telephone Encounter (Signed)
OK X1  Seen 5/16

## 2015-04-30 NOTE — Telephone Encounter (Signed)
Pts last OV 3/15. Please advise

## 2015-05-01 NOTE — Telephone Encounter (Signed)
Please advise. Pt has not been seen by you since 3/15

## 2015-05-22 ENCOUNTER — Other Ambulatory Visit: Payer: Self-pay | Admitting: Internal Medicine

## 2015-06-26 ENCOUNTER — Encounter: Payer: Self-pay | Admitting: Internal Medicine

## 2015-06-26 ENCOUNTER — Ambulatory Visit (INDEPENDENT_AMBULATORY_CARE_PROVIDER_SITE_OTHER): Payer: BLUE CROSS/BLUE SHIELD | Admitting: Internal Medicine

## 2015-06-26 VITALS — BP 118/70 | HR 68 | Temp 97.9°F | Wt 150.0 lb

## 2015-06-26 DIAGNOSIS — T7840XD Allergy, unspecified, subsequent encounter: Secondary | ICD-10-CM | POA: Diagnosis not present

## 2015-06-26 MED ORDER — MONTELUKAST SODIUM 10 MG PO TABS: 10.0000 mg | ORAL_TABLET | Freq: Every day | ORAL | Status: DC

## 2015-06-26 NOTE — Progress Notes (Signed)
   Subjective:    Patient ID: Nicole Dunn, female    DOB: 1965-11-09, 49 y.o.   MRN: [id number]  HPI  She believes she's having increasing allergic symptoms. In late September this year she developed a sore throat and was treated with Rocephin and Decadron @ the urgent care. Within 24 hours she had marked redness of her face and neck with progression to the thorax and upper extremities. She also had  associated urticaria.  She had a similar but milder reaction including rhinitis after cutting the grass. She's been taking Benadryl prn.  On 06/24/15 she had a similar response after being exposed to air freshener.  She denies itchy, watery eyes or sneezing. She has no other extrinsic symptoms or symptoms of upper respiratory tract infection  Review of Systems Frontal headache, facial pain , nasal purulence, dental pain, sore throat , otic pain or otic discharge denied @ this time. No fever , chills or sweats.  Extrinsic symptoms of angioedema are denied. There is no significant cough, sputum production, wheezing,or  paroxysmal nocturnal dyspnea.     Objective:   Physical Exam Pertinent or positive findings include: No dermatographia can be elicited. The nares are dry. The aorta is palpable but there is no aortic aneurysm.  General appearance :adequately nourished; in no distress.  Eyes: No conjunctival inflammation or scleral icterus is present.  Oral exam:  Lips and gums are healthy appearing.There is no oropharyngeal erythema or exudate noted. Dental hygiene is good.  Heart:  Normal rate and regular rhythm. S1 and S2 normal without gallop, murmur, click, rub or other extra sounds    Lungs:Chest clear to auscultation; no wheezes, rhonchi,rales ,or rubs present.No increased work of breathing.   Abdomen: bowel sounds normal, soft and non-tender without masses, organomegaly or hernias noted.  No guarding or rebound.   Vascular : all pulses equal ; no bruits present.  Skin:Warm &  dry.  Intact without suspicious lesions or rashes ; no tenting or jaundice   Lymphatic: No lymphadenopathy is noted about the head, neck, axilla   Neuro: Strength, tone & DTRs normal.      Assessment & Plan:  #1 allergic reaction, multifactorial.  Plan: See orders and after visit summary.

## 2015-06-26 NOTE — Patient Instructions (Signed)
Plain Mucinex (NOT D) for thick secretions ;force NON dairy fluids .   Nasal cleansing in the shower as discussed with lather of mild shampoo.After 10 seconds wash off lather while  exhaling through nostrils. Make sure that all residual soap is removed to prevent irritation.  Flonase OR Nasacort AQ 1 spray in each nostril twice a day as needed. Use the "crossover" technique into opposite nostril spraying toward opposite ear @ 45 degree angle, not straight up into nostril.  Plain Allegra (NOT D )  160 mg in am and generic Singulair in evening  as needed for allergic symptoms.

## 2015-06-26 NOTE — Progress Notes (Signed)
.  lbp 

## 2015-06-27 ENCOUNTER — Other Ambulatory Visit: Payer: Self-pay | Admitting: Emergency Medicine

## 2015-06-27 NOTE — Telephone Encounter (Signed)
LVM for pt to call back in regards to her Hydroxyzine rx

## 2015-07-18 ENCOUNTER — Telehealth: Payer: Self-pay | Admitting: Internal Medicine

## 2015-07-18 ENCOUNTER — Other Ambulatory Visit: Payer: Self-pay | Admitting: Internal Medicine

## 2015-07-18 NOTE — Telephone Encounter (Signed)
Patient is requesting montelukast and propranolol [facility]

## 2015-07-19 ENCOUNTER — Other Ambulatory Visit: Payer: Self-pay

## 2015-07-19 DIAGNOSIS — T7840XD Allergy, unspecified, subsequent encounter: Secondary | ICD-10-CM

## 2015-07-19 MED ORDER — PROPRANOLOL HCL 10 MG PO TABS: ORAL_TABLET | ORAL | Status: DC

## 2015-07-19 MED ORDER — MONTELUKAST SODIUM 10 MG PO TABS: 10.0000 mg | ORAL_TABLET | Freq: Every day | ORAL | Status: DC

## 2015-07-19 NOTE — Telephone Encounter (Signed)
[facility]

## 2015-07-23 MED ORDER — HYDROXYZINE PAMOATE 25 MG PO CAPS: ORAL_CAPSULE | ORAL | Status: DC

## 2015-07-24 ENCOUNTER — Other Ambulatory Visit: Payer: Self-pay | Admitting: Emergency Medicine

## 2015-07-24 DIAGNOSIS — T7840XD Allergy, unspecified, subsequent encounter: Secondary | ICD-10-CM

## 2015-07-24 MED ORDER — MONTELUKAST SODIUM 10 MG PO TABS: 10.0000 mg | ORAL_TABLET | Freq: Every day | ORAL | Status: DC

## 2015-08-08 ENCOUNTER — Telehealth: Payer: Self-pay

## 2015-08-08 NOTE — Telephone Encounter (Signed)
Left message asking patient to call back to schedule flu vaccine

## 2015-08-31 ENCOUNTER — Other Ambulatory Visit: Payer: Self-pay | Admitting: Internal Medicine

## 2015-09-03 ENCOUNTER — Other Ambulatory Visit: Payer: Self-pay | Admitting: Obstetrics and Gynecology

## 2015-09-03 DIAGNOSIS — R928 Other abnormal and inconclusive findings on diagnostic imaging of breast: Secondary | ICD-10-CM

## 2015-09-04 ENCOUNTER — Encounter: Payer: BLUE CROSS/BLUE SHIELD | Admitting: Sports Medicine

## 2015-09-06 ENCOUNTER — Ambulatory Visit
Admission: RE | Admit: 2015-09-06 | Discharge: 2015-09-06 | Disposition: A | Discharge status: Home / Self Care | Payer: BLUE CROSS/BLUE SHIELD | Source: Ambulatory Visit | Attending: Obstetrics and Gynecology | Admitting: Obstetrics and Gynecology

## 2015-09-06 DIAGNOSIS — R928 Other abnormal and inconclusive findings on diagnostic imaging of breast: Secondary | ICD-10-CM

## 2015-11-12 DIAGNOSIS — F331 Major depressive disorder, recurrent, moderate: Secondary | ICD-10-CM | POA: Diagnosis not present

## 2015-12-03 DIAGNOSIS — F331 Major depressive disorder, recurrent, moderate: Secondary | ICD-10-CM | POA: Diagnosis not present

## 2016-01-21 DIAGNOSIS — F411 Generalized anxiety disorder: Secondary | ICD-10-CM | POA: Diagnosis not present

## 2016-01-21 DIAGNOSIS — F331 Major depressive disorder, recurrent, moderate: Secondary | ICD-10-CM | POA: Diagnosis not present

## 2016-01-30 ENCOUNTER — Other Ambulatory Visit: Payer: Self-pay | Admitting: Internal Medicine

## 2016-04-07 ENCOUNTER — Other Ambulatory Visit (INDEPENDENT_AMBULATORY_CARE_PROVIDER_SITE_OTHER): Payer: BLUE CROSS/BLUE SHIELD

## 2016-04-07 ENCOUNTER — Ambulatory Visit (INDEPENDENT_AMBULATORY_CARE_PROVIDER_SITE_OTHER): Payer: BLUE CROSS/BLUE SHIELD | Admitting: Internal Medicine

## 2016-04-07 ENCOUNTER — Encounter: Payer: Self-pay | Admitting: Internal Medicine

## 2016-04-07 VITALS — BP 134/82 | HR 89 | Resp 16 | Ht 63.0 in | Wt 148.0 lb

## 2016-04-07 DIAGNOSIS — R7989 Other specified abnormal findings of blood chemistry: Secondary | ICD-10-CM

## 2016-04-07 DIAGNOSIS — F411 Generalized anxiety disorder: Secondary | ICD-10-CM

## 2016-04-07 DIAGNOSIS — G43009 Migraine without aura, not intractable, without status migrainosus: Secondary | ICD-10-CM

## 2016-04-07 DIAGNOSIS — Z0000 Encounter for general adult medical examination without abnormal findings: Secondary | ICD-10-CM

## 2016-04-07 DIAGNOSIS — M5440 Lumbago with sciatica, unspecified side: Secondary | ICD-10-CM

## 2016-04-07 DIAGNOSIS — K219 Gastro-esophageal reflux disease without esophagitis: Secondary | ICD-10-CM | POA: Diagnosis not present

## 2016-04-07 DIAGNOSIS — F331 Major depressive disorder, recurrent, moderate: Secondary | ICD-10-CM | POA: Diagnosis not present

## 2016-04-07 LAB — LIPID PANEL
Cholesterol: 154 mg/dL (ref 0–200)
HDL: 48.5 mg/dL (ref >39.00)
NonHDL: 105.48
TRIGLYCERIDES: 282 mg/dL — AB (ref 0.0–149.0)
Total CHOL/HDL Ratio: 3
VLDL: 56.4 mg/dL — ABNORMAL HIGH (ref 0.0–40.0)

## 2016-04-07 LAB — COMPREHENSIVE METABOLIC PANEL
ALT: 20 U/L (ref 0–35)
AST: 15 U/L (ref 0–37)
Albumin: 4.2 g/dL (ref 3.5–5.2)
Alkaline Phosphatase: 46 U/L (ref 39–117)
BILIRUBIN TOTAL: 0.2 mg/dL (ref 0.2–1.2)
BUN: 19 mg/dL (ref 6–23)
CALCIUM: 8.7 mg/dL (ref 8.4–10.5)
CHLORIDE: 108 meq/L (ref 96–112)
CO2: 25 meq/L (ref 19–32)
CREATININE: 1.12 mg/dL (ref 0.40–1.20)
GFR: 54.77 mL/min — ABNORMAL LOW (ref >60.00)
GLUCOSE: 105 mg/dL — AB (ref 70–99)
Potassium: 3.8 mEq/L (ref 3.5–5.1)
SODIUM: 140 meq/L (ref 135–145)
Total Protein: 6.7 g/dL (ref 6.0–8.3)

## 2016-04-07 LAB — CBC WITH DIFFERENTIAL/PLATELET
BASOS ABS: 0 10*3/uL (ref 0.0–0.1)
BASOS PCT: 0.3 % (ref 0.0–3.0)
EOS ABS: 0.1 10*3/uL (ref 0.0–0.7)
Eosinophils Relative: 1.5 % (ref 0.0–5.0)
HCT: 36.1 % (ref 36.0–46.0)
Hemoglobin: 12.5 g/dL (ref 12.0–15.0)
LYMPHS ABS: 1.4 10*3/uL (ref 0.7–4.0)
Lymphocytes Relative: 28 % (ref 12.0–46.0)
MCHC: 34.7 g/dL (ref 30.0–36.0)
MCV: 93 fl (ref 78.0–100.0)
MONO ABS: 0.3 10*3/uL (ref 0.1–1.0)
Monocytes Relative: 6 % (ref 3.0–12.0)
NEUTROS ABS: 3.1 10*3/uL (ref 1.4–7.7)
NEUTROS PCT: 64.2 % (ref 43.0–77.0)
PLATELETS: 178 10*3/uL (ref 150.0–400.0)
RBC: 3.88 Mil/uL (ref 3.87–5.11)
RDW: 12.6 % (ref 11.5–15.5)
WBC: 4.9 10*3/uL (ref 4.0–10.5)

## 2016-04-07 LAB — TSH: TSH: 1.75 u[IU]/mL (ref 0.35–4.50)

## 2016-04-07 MED ORDER — GABAPENTIN 300 MG PO CAPS: ORAL_CAPSULE | ORAL | Status: DC

## 2016-04-07 MED ORDER — PROPRANOLOL HCL 10 MG PO TABS: ORAL_TABLET | ORAL | 3 refills | Status: DC

## 2016-04-07 NOTE — Progress Notes (Signed)
Subjective:    Patient ID: Nicole Dunn, female    DOB: 1965/11/11, 50 y.o.   MRN: [id number]  HPI She is here to establish with a new pcp.  She is here for a physical exam.   She denies any changes in her history and has no concerns.    Medications and allergies reviewed with patient and updated if appropriate.  Patient Active Problem List   Diagnosis Date Noted  . Elevated blood-pressure reading without diagnosis of hypertension 10/13/2013  . PVC (premature ventricular contraction) 07/29/2011  . 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 Endometrial hyperplasia 07/10/2011  . Anxiety state 12/28/2008  . GERD 12/28/2008  . Migraine without aura 07/23/2007  . CERVICAL RADICULOPATHY 07/23/2007  . Lumbar pain 07/23/2007    Current Outpatient Prescriptions on File Prior to Visit  Medication Sig Dispense Refill  . AMBULATORY NON FORMULARY MEDICATION Topical Cream: ? Name (Rx;ed by Neurologist)    . aspirin 81 MG tablet Take 81 mg by mouth daily.    Marland Kitchen buPROPion (WELLBUTRIN XL) 300 MG 24 hr tablet Take 1 tablet (300 mg total) by mouth daily. Must transition care to new provider for refills 90 tablet 0  . cyclobenzaprine (FLEXERIL) 10 MG tablet Take 10 mg by mouth 3 (three) times daily as needed.    . gabapentin (NEURONTIN) 300 MG capsule Take 300 mg by mouth 3 (three) times daily.    . Hydrocodone-Acetaminophen (VICODIN ES PO) Take by mouth. 7.5-325  1 by mouth every 8 hours as needed, rx'ed by Dr.Dalton-Bethea    . Magnesium 400 MG TABS Take by mouth.    . Probiotic Product (SOLUBLE FIBER/PROBIOTICS PO) Take by mouth.    . propranolol (INDERAL) 10 MG tablet TAKE 2 TABLET BY MOUTH EVERY 8-12 HOURS AS NEEDED 90 tablet 3  . ranitidine (ZANTAC) 150 MG capsule Take 150 mg by mouth 2 (two) times daily.     No current facility-administered medications on file prior to visit.     Past Medical History:  Diagnosis Date  . Anxiety   . Common migraine    with prodrome visual auro,  "black spot with blind peripheral ring"  . Degenerative disc disease    with C6-7 encroachment & C3-4 narrowing  . Degenerative disc disease, lumbar    L5-S1, Dr. Niel Hummer, Neurology  . Endometrial hyperplasia 07/10/2011   Dr Lennie Muckle  . GERD (gastroesophageal reflux disease)   . Hypertriglyceridemia 2011   TG 176; HDL 58.9; LDL 121.7  . Nonspecific elevation of levels of transaminase or lactic acid dehydrogenase (LDH) 2011    ALT 61    Past Surgical History:  Procedure Laterality Date  . ENDOMETRIAL BIOPSY  05/20/2011   Dr Pamala Hurry   . ENDOSCOPIC PLANTAR FASCIOTOMY  2011   left foot, Dr. Paulla Dolly  . MENISCUS REPAIR  2012   Dr  Noemi Chapel  . otic tubes     bilaterally @age  4  . TONSILLECTOMY    . TOTAL ABDOMINAL HYSTERECTOMY  05/2011   Dr Nancy Marus, [facility]    Social History   Social History  . Marital status: Married    Spouse name: N/A  . Number of children: N/A  . Years of education: N/A   Social History Main Topics  . Smoking status: Former Smoker    Quit date: 12/10/1998  . Smokeless tobacco: None     Comment: smoked 1983-2000, up to 1/2 ppd  . Alcohol use Yes     Comment: rarely  .  Drug use: No  . Sexual activity: Yes   Other Topics Concern  . None   Social History Narrative  . None    Family History  Problem Relation Age of Onset  . Hypertension Father   . Deep vein thrombosis Father   . Pulmonary embolism Father   . Hypertension Mother   . Kidney failure Mother     transitory/iatrogenic  . Liver disease Mother     hepatic insufficiency  . OCD Mother   . Breast cancer Mother   . Peripheral vascular disease Brother   . Clotting disorder Paternal Grandfather   . Pulmonary embolism Paternal Grandfather   . Bipolar disorder      sister & brother  . Depression      sister & brother  . Heart attack Maternal Grandmother     in 79s  . Depression Other   . Asthma Neg Hx   . Diabetes Neg Hx   . Stroke Neg Hx     Review of Systems    Constitutional: Negative for appetite change, chills, fatigue, fever and unexpected weight change.  Eyes: Negative for visual disturbance.  Respiratory: Negative for cough, shortness of breath and wheezing.   Cardiovascular: Positive for leg swelling. Negative for chest pain and palpitations.  Gastrointestinal: Negative for abdominal pain, blood in stool, constipation and diarrhea.       Gerd controlled  Genitourinary: Negative for dysuria and hematuria.  Musculoskeletal: Positive for back pain (chronic) and neck pain (chronic).  Skin: Negative for color change and rash.  Neurological: Positive for headaches. Negative for dizziness, light-headedness and numbness.  Psychiatric/Behavioral: Positive for dysphoric mood. The patient is nervous/anxious.        Objective:   Vitals:   04/07/16 1611  BP: 134/82  Pulse: 89  Resp: 16   Filed Weights   04/07/16 1611  Weight: 148 lb (67.1 kg)   Body mass index is 26.22 kg/m.   Physical Exam Constitutional: She appears well-developed and well-nourished. No distress.  HENT:  Head: Normocephalic and atraumatic.  Right Ear: External ear normal. Normal ear canal and TM Left Ear: External ear normal.  Normal ear canal and TM Mouth/Throat: Oropharynx is clear and moist.  Eyes: Conjunctivae and EOM are normal.  Neck: Neck supple. No tracheal deviation present. No thyromegaly present.  No carotid bruit  Cardiovascular: Normal rate, regular rhythm and normal heart sounds.   No murmur heard.  trace edema - wearing compression socks Pulmonary/Chest: Effort normal and breath sounds normal. No respiratory distress. She has no wheezes. She has no rales.  Breast: deferred to Gyn Abdominal: Soft. She exhibits no distension. There is no tenderness.  Lymphadenopathy: She has no cervical adenopathy.  Skin: Skin is warm and dry. She is not diaphoretic.  Psychiatric: She has a normal mood and affect. Her behavior is normal.         Assessment &  Plan:   Physical exam: Screening blood  ordered Immunizations Up to date  Mammogram  Up to date  Gyn  Up to date  Exercise  - trying to exercise regularly Weight  - BMI elevated, recommended weight loss Skin  - no concerns Substance abuse  -  none  See Problem List for Assessment and Plan of chronic medical problems.  F/u annually

## 2016-04-07 NOTE — Progress Notes (Signed)
Pre visit review using our clinic review tool, if applicable. No additional management support is needed unless otherwise documented below in the visit note. 

## 2016-04-07 NOTE — Assessment & Plan Note (Signed)
Taking wellbutrin, buspar Controlled I refill wellbutrin and will fill when needed

## 2016-04-07 NOTE — Assessment & Plan Note (Signed)
Takes  Propranolol as needed, which works well

## 2016-04-07 NOTE — Patient Instructions (Addendum)
Test(s) ordered today. Your results will be released to [place] (or called to you) after review, usually within 72hours after test completion. If any changes need to be made, you will be notified at that same time.  All other Health Maintenance issues reviewed.   All recommended immunizations and age-appropriate screenings are up-to-date or discussed.  No immunizations administered today.   Medications reviewed and updated.  No changes recommended at this time.  Your prescription(s) have been submitted to your pharmacy. Please take as directed and contact our office if you believe you are having problem(s) with the medication(s).   Please followup in one year   Health Maintenance, Female Adopting a healthy lifestyle and getting preventive care can go a long way to promote health and wellness. Talk with your health care provider about what schedule of regular examinations is right for you. This is a good chance for you to check in with your provider about disease prevention and staying healthy. In between checkups, there are plenty of things you can do on your own. Experts have done a lot of research about which lifestyle changes and preventive measures are most likely to keep you healthy. Ask your health care provider for more information. WEIGHT AND DIET  Eat a healthy diet  Be sure to include plenty of vegetables, fruits, low-fat dairy products, and lean protein.  Do not eat a lot of foods high in solid fats, added sugars, or salt.  Get regular exercise. This is one of the most important things you can do for your health.  Most adults should exercise for at least 150 minutes each week. The exercise should increase your heart rate and make you sweat (moderate-intensity exercise).  Most adults should also do strengthening exercises at least twice a week. This is in addition to the moderate-intensity exercise.  Maintain a healthy weight  Body mass index (BMI) is a measurement that  can be used to identify possible weight problems. It estimates body fat based on height and weight. Your health care provider can help determine your BMI and help you achieve or maintain a healthy weight.  For females 31 years of age and older:   A BMI below 18.5 is considered underweight.  A BMI of 18.5 to 24.9 is normal.  A BMI of 25 to 29.9 is considered overweight.  A BMI of 30 and above is considered obese.  Watch levels of cholesterol and blood lipids  You should start having your blood tested for lipids and cholesterol at 50 years of age, then have this test every 5 years.  You may need to have your cholesterol levels checked more often if:  Your lipid or cholesterol levels are high.  You are older than 50 years of age.  You are at high risk for heart disease.  CANCER SCREENING   Lung Cancer  Lung cancer screening is recommended for adults 76-31 years old who are at high risk for lung cancer because of a history of smoking.  A yearly low-dose CT scan of the lungs is recommended for people who:  Currently smoke.  Have quit within the past 15 years.  Have at least a 30-pack-year history of smoking. A pack year is smoking an average of one pack of cigarettes a day for 1 year.  Yearly screening should continue until it has been 15 years since you quit.  Yearly screening should stop if you develop a health problem that would prevent you from having lung cancer treatment.  Breast Cancer  Practice breast self-awareness. This means understanding how your breasts normally appear and feel.  It also means doing regular breast self-exams. Let your health care provider know about any changes, no matter how small.  If you are in your 20s or 30s, you should have a clinical breast exam (CBE) by a health care provider every 1-3 years as part of a regular health exam.  If you are 78 or older, have a CBE every year. Also consider having a breast X-ray (mammogram) every  year.  If you have a family history of breast cancer, talk to your health care provider about genetic screening.  If you are at high risk for breast cancer, talk to your health care provider about having an MRI and a mammogram every year.  Breast cancer gene (BRCA) assessment is recommended for women who have family members with BRCA-related cancers. BRCA-related cancers include:  Breast.  Ovarian.  Tubal.  Peritoneal cancers.  Results of the assessment will determine the need for genetic counseling and BRCA1 and BRCA2 testing. Cervical Cancer Your health care provider may recommend that you be screened regularly for cancer of the pelvic organs (ovaries, uterus, and vagina). This screening involves a pelvic examination, including checking for microscopic changes to the surface of your cervix (Pap test). You may be encouraged to have this screening done every 3 years, beginning at age 20.  For women ages 3-65, health care providers may recommend pelvic exams and Pap testing every 3 years, or they may recommend the Pap and pelvic exam, combined with testing for human papilloma virus (HPV), every 5 years. Some types of HPV increase your risk of cervical cancer. Testing for HPV may also be done on women of any age with unclear Pap test results.  Other health care providers may not recommend any screening for nonpregnant women who are considered low risk for pelvic cancer and who do not have symptoms. Ask your health care provider if a screening pelvic exam is right for you.  If you have had past treatment for cervical cancer or a condition that could lead to cancer, you need Pap tests and screening for cancer for at least 20 years after your treatment. If Pap tests have been discontinued, your risk factors (such as having a new sexual partner) need to be reassessed to determine if screening should resume. Some women have medical problems that increase the chance of getting cervical cancer. In  these cases, your health care provider may recommend more frequent screening and Pap tests. Colorectal Cancer  This type of cancer can be detected and often prevented.  Routine colorectal cancer screening usually begins at 50 years of age and continues through 50 years of age.  Your health care provider may recommend screening at an earlier age if you have risk factors for colon cancer.  Your health care provider may also recommend using home test kits to check for hidden blood in the stool.  A small camera at the end of a tube can be used to examine your colon directly (sigmoidoscopy or colonoscopy). This is done to check for the earliest forms of colorectal cancer.  Routine screening usually begins at age 21.  Direct examination of the colon should be repeated every 5-10 years through 50 years of age. However, you may need to be screened more often if early forms of precancerous polyps or small growths are found. Skin Cancer  Check your skin from head to toe regularly.  Tell your health care provider about any  new moles or changes in moles, especially if there is a change in a mole's shape or color.  Also tell your health care provider if you have a mole that is larger than the size of a pencil eraser.  Always use sunscreen. Apply sunscreen liberally and repeatedly throughout the day.  Protect yourself by wearing long sleeves, pants, a wide-brimmed hat, and sunglasses whenever you are outside. HEART DISEASE, DIABETES, AND HIGH BLOOD PRESSURE   High blood pressure causes heart disease and increases the risk of stroke. High blood pressure is more likely to develop in:  People who have blood pressure in the high end of the normal range (130-139/85-89 mm Hg).  People who are overweight or obese.  People who are African American.  If you are 18-39 years of age, have your blood pressure checked every 3-5 years. If you are 40 years of age or older, have your blood pressure checked  every year. You should have your blood pressure measured twice--once when you are at a hospital or clinic, and once when you are not at a hospital or clinic. Record the average of the two measurements. To check your blood pressure when you are not at a hospital or clinic, you can use:  An automated blood pressure machine at a pharmacy.  A home blood pressure monitor.  If you are between 55 years and 79 years old, ask your health care provider if you should take aspirin to prevent strokes.  Have regular diabetes screenings. This involves taking a blood sample to check your fasting blood sugar level.  If you are at a normal weight and have a low risk for diabetes, have this test once every three years after 50 years of age.  If you are overweight and have a high risk for diabetes, consider being tested at a younger age or more often. PREVENTING INFECTION  Hepatitis B  If you have a higher risk for hepatitis B, you should be screened for this virus. You are considered at high risk for hepatitis B if:  You were born in a country where hepatitis B is common. Ask your health care provider which countries are considered high risk.  Your parents were born in a high-risk country, and you have not been immunized against hepatitis B (hepatitis B vaccine).  You have HIV or AIDS.  You use needles to inject street drugs.  You live with someone who has hepatitis B.  You have had sex with someone who has hepatitis B.  You get hemodialysis treatment.  You take certain medicines for conditions, including cancer, organ transplantation, and autoimmune conditions. Hepatitis C  Blood testing is recommended for:  Everyone born from 1945 through 1965.  Anyone with known risk factors for hepatitis C. Sexually transmitted infections (STIs)  You should be screened for sexually transmitted infections (STIs) including gonorrhea and chlamydia if:  You are sexually active and are younger than 50 years  of age.  You are older than 50 years of age and your health care provider tells you that you are at risk for this type of infection.  Your sexual activity has changed since you were last screened and you are at an increased risk for chlamydia or gonorrhea. Ask your health care provider if you are at risk.  If you do not have HIV, but are at risk, it may be recommended that you take a prescription medicine daily to prevent HIV infection. This is called pre-exposure prophylaxis (PrEP). You are considered at risk   if:  You are sexually active and do not regularly use condoms or know the HIV status of your partner(s).  You take drugs by injection.  You are sexually active with a partner who has HIV. Talk with your health care provider about whether you are at high risk of being infected with HIV. If you choose to begin PrEP, you should first be tested for HIV. You should then be tested every 3 months for as long as you are taking PrEP.  PREGNANCY   If you are premenopausal and you may become pregnant, ask your health care provider about preconception counseling.  If you may become pregnant, take 400 to 800 micrograms (mcg) of folic acid every day.  If you want to prevent pregnancy, talk to your health care provider about birth control (contraception). OSTEOPOROSIS AND MENOPAUSE   Osteoporosis is a disease in which the bones lose minerals and strength with aging. This can result in serious bone fractures. Your risk for osteoporosis can be identified using a bone density scan.  If you are 65 years of age or older, or if you are at risk for osteoporosis and fractures, ask your health care provider if you should be screened.  Ask your health care provider whether you should take a calcium or vitamin D supplement to lower your risk for osteoporosis.  Menopause may have certain physical symptoms and risks.  Hormone replacement therapy may reduce some of these symptoms and risks. Talk to your  health care provider about whether hormone replacement therapy is right for you.  HOME CARE INSTRUCTIONS   Schedule regular health, dental, and eye exams.  Stay current with your immunizations.   Do not use any tobacco products including cigarettes, chewing tobacco, or electronic cigarettes.  If you are pregnant, do not drink alcohol.  If you are breastfeeding, limit how much and how often you drink alcohol.  Limit alcohol intake to no more than 1 drink per day for nonpregnant women. One drink equals 12 ounces of beer, 5 ounces of wine, or 1 ounces of hard liquor.  Do not use street drugs.  Do not share needles.  Ask your health care provider for help if you need support or information about quitting drugs.  Tell your health care provider if you often feel depressed.  Tell your health care provider if you have ever been abused or do not feel safe at home.   This information is not intended to replace advice given to you by your health care provider. Make sure you discuss any questions you have with your health care provider.   Document Released: 02/10/2011 Document Revised: 08/18/2014 Document Reviewed: 06/29/2013 Elsevier Interactive Patient Education 2016 [employer]  

## 2016-04-07 NOTE — Assessment & Plan Note (Signed)
Following with pain management Taking vicodin as needed Taking gabapentin

## 2016-04-07 NOTE — Assessment & Plan Note (Signed)
Takes zantac once a day GERD controlled Continue daily medication  

## 2016-04-07 NOTE — Assessment & Plan Note (Signed)
Following with pain management Taking gabapentin and vicodin

## 2016-04-08 LAB — LDL CHOLESTEROL, DIRECT: LDL DIRECT: 66 mg/dL

## 2016-04-11 ENCOUNTER — Telehealth: Payer: Self-pay

## 2016-04-11 DIAGNOSIS — M47812 Spondylosis without myelopathy or radiculopathy, cervical region: Secondary | ICD-10-CM | POA: Diagnosis not present

## 2016-04-11 DIAGNOSIS — M47816 Spondylosis without myelopathy or radiculopathy, lumbar region: Secondary | ICD-10-CM | POA: Diagnosis not present

## 2016-04-11 NOTE — Telephone Encounter (Signed)
Patient calling in about her lab work. I informed her of the notes! Thank you.

## 2016-04-18 DIAGNOSIS — M1288 Other specific arthropathies, not elsewhere classified, other specified site: Secondary | ICD-10-CM | POA: Diagnosis not present

## 2016-04-18 DIAGNOSIS — M5137 Other intervertebral disc degeneration, lumbosacral region: Secondary | ICD-10-CM | POA: Diagnosis not present

## 2016-04-18 DIAGNOSIS — M791 Myalgia: Secondary | ICD-10-CM | POA: Diagnosis not present

## 2016-04-18 DIAGNOSIS — M7711 Lateral epicondylitis, right elbow: Secondary | ICD-10-CM | POA: Diagnosis not present

## 2016-04-25 DIAGNOSIS — H1852 Epithelial (juvenile) corneal dystrophy: Secondary | ICD-10-CM | POA: Diagnosis not present

## 2016-04-25 DIAGNOSIS — S0501XA Injury of conjunctiva and corneal abrasion without foreign body, right eye, initial encounter: Secondary | ICD-10-CM | POA: Diagnosis not present

## 2016-05-12 ENCOUNTER — Encounter: Payer: Self-pay | Admitting: Podiatry

## 2016-05-12 ENCOUNTER — Ambulatory Visit (INDEPENDENT_AMBULATORY_CARE_PROVIDER_SITE_OTHER): Payer: BLUE CROSS/BLUE SHIELD | Admitting: Podiatry

## 2016-05-12 VITALS — BP 128/82 | HR 75 | Resp 16

## 2016-05-12 DIAGNOSIS — L600 Ingrowing nail: Secondary | ICD-10-CM

## 2016-05-12 NOTE — Patient Instructions (Signed)

## 2016-05-13 NOTE — Progress Notes (Signed)
Subjective:     Patient ID: Nicole Dunn, female   DOB: July 01, 1966, 50 y.o.   MRN: [id number]  HPI patient states my left big toe has started to get sore and I know it ingrown toenail   Review of Systems     Objective:   Physical Exam Neurovascular status intact with incurvated left hallux medial lateral borders are painful when pressed with distal redness noted but no active drainage    Assessment:     Ingrown toenail deformity left hallux medial and lateral border    Plan:     H&P reviewed and recommended correction of nail borders and explained procedure risk. Patient wants surgery and today I infiltrated 60 mg like Marcaine mixture remove the borders exposed matrix and applied phenol 3 applications 30 seconds followed by alcohol lavage and sterile dressing. Gave instructions on soaks and reappoint

## 2016-05-20 ENCOUNTER — Encounter: Payer: Self-pay | Admitting: Sports Medicine

## 2016-05-20 ENCOUNTER — Ambulatory Visit (INDEPENDENT_AMBULATORY_CARE_PROVIDER_SITE_OTHER): Payer: BLUE CROSS/BLUE SHIELD | Admitting: Sports Medicine

## 2016-05-20 DIAGNOSIS — Z9889 Other specified postprocedural states: Secondary | ICD-10-CM

## 2016-05-20 DIAGNOSIS — M79675 Pain in left toe(s): Secondary | ICD-10-CM

## 2016-05-20 NOTE — Progress Notes (Signed)
Subjective: Nicole Dunn is a 50 y.o. female patient returns to office today for follow up evaluation after having Left Hallux medial and lateral permanent nail avulsions performed on 05-12-16 by Dr. Paulla Dolly. Patient has been soaking using hibiclenzt and applying topical antibiotic covered with bandaid daily. Reports soreness when trying to wear closed toed shoes. Patient deniesfever/chills/nausea/vomitting/any other related constitutional symptoms at this time.  Patient Active Problem List   Diagnosis Date Noted  . PVC (premature ventricular contraction) 07/29/2011  . 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 Anxiety state 12/28/2008  . GERD 12/28/2008  . Migraine without aura 07/23/2007  . CERVICAL RADICULOPATHY 07/23/2007  . Lumbar pain 07/23/2007    Current Outpatient Prescriptions on File Prior to Visit  Medication Sig Dispense Refill  . AMBULATORY NON FORMULARY MEDICATION Topical Cream: ? Name (Rx;ed by Neurologist)    . aspirin 81 MG tablet Take 81 mg by mouth daily.    Marland Kitchen buPROPion (WELLBUTRIN XL) 300 MG 24 hr tablet Take 1 tablet (300 mg total) by mouth daily. Must transition care to new provider for refills 90 tablet 0  . busPIRone (BUSPAR) 5 MG tablet     . gabapentin (NEURONTIN) 300 MG capsule 600 mg in am 900 mg pm    . Hydrocodone-Acetaminophen (VICODIN ES PO) Take by mouth. 7.5-325  1 by mouth every 8 hours as needed, rx'ed by Dr.Dalton-Bethea    . Magnesium 400 MG TABS Take by mouth.    . mirtazapine (REMERON SOL-TAB) 15 MG disintegrating tablet     . Probiotic Product (SOLUBLE FIBER/PROBIOTICS PO) Take by mouth.    . propranolol (INDERAL) 10 MG tablet TAKE 2 TABLET BY MOUTH EVERY 8-12 HOURS AS NEEDED 90 tablet 3  . ranitidine (ZANTAC) 150 MG capsule Take 150 mg by mouth 2 (two) times daily.    . Vitamin D, Ergocalciferol, (DRISDOL) 50000 units CAPS capsule Take 50,000 Units by mouth daily.     No current facility-administered medications on file prior to visit.      Allergies  Allergen Reactions  . Rocephin [Ceftriaxone Sodium In Dextrose] Rash    Redness of face, trunk , UE  . Escitalopram Oxalate     Sexual dysfunction  . Topiramate     Mood change, anger    Objective:  General: Well developed, nourished, in no acute distress, alert and oriented x3   Dermatology: Skin is warm, dry and supple bilateral. Left hallux medial and lateral nail bed appears to be clean, dry, with mild granular tissue and surrounding eschar/scab. (+) focal Erythema likely from irritation no cellulitis. (-) Edema. (-) serosanguous drainage present. The remaining nails appear unremarkable at this time. There are no other lesions or other signs of infection  present.  Neurovascular status: Intact. No lower extremity swelling; No pain with calf compression bilateral.  Musculoskeletal: Mild tenderness to palpation of the Left hallux nail fold(s). Muscular strength within normal limits bilateral.   Assesement and Plan: Problem List Items Addressed This Visit    None    Visit Diagnoses    S/P nail surgery    -  Primary   Toe pain, left          -Examined patient  -Cleansed left hallux medial and lateral nail fold and gently scrubbed with peroxide and q-tip/curetted away eschar at site and applied antibiotic cream covered with bandaid.  -Discussed plan of care with patient. -Patient to now begin soaking in a weak solution of Epsom salt and warm water. Advised  to refrain from [place] because may be too harsh for the skin. Patient was instructed to soak for 15-20 minutes each day until the toe appears normal and there is no drainage, redness, tenderness, or swelling at the procedure site, and apply neosporin and a gauze or bandaid dressing each day as needed. May leave open to air at night. -Educated patient on long term care after nail surgery. -Patient was instructed to monitor the toe for reoccurrence and signs of infection; Patient advised to return to office or go  to ER if toe becomes red, hot or swollen. -Work note: Open toed shoes  -Patient is to return as needed or sooner if problems arise.  Landis Martins, DPM

## 2016-05-20 NOTE — Patient Instructions (Signed)

## 2016-05-26 ENCOUNTER — Telehealth: Payer: Self-pay | Admitting: *Deleted

## 2016-05-26 NOTE — Telephone Encounter (Signed)
In addition make sure she is leaving it open to air at night and not tightly bandaging the toe during the day  -Dr. Cannon Kettle

## 2016-05-26 NOTE — Telephone Encounter (Addendum)
Pt states she was seen about a week ago and Dr. Cannon Kettle stated the toe was not infected, but it still looks the same and hurts,she has been following all the instructions. I reviewed pt's LOV and she is to use Epsom Salt soaks and neosporin ointment.  I spoke with pt, told her the toe could be red, swollen and draining for 4-6 weeks, that the epsom salt soak would help dry the drainage and to switch to Bacitracin ointment, often polysporin and neosporin can be to irritating to some people.  I told pt I would inform Dr. Cannon Kettle of the current symptoms and call if there were changes. Called pt with Dr. Leeanne Rio orders to allow to air dry after the evening soak. Pt states she is doing that also.

## 2016-06-30 DIAGNOSIS — F411 Generalized anxiety disorder: Secondary | ICD-10-CM | POA: Diagnosis not present

## 2016-06-30 DIAGNOSIS — F331 Major depressive disorder, recurrent, moderate: Secondary | ICD-10-CM | POA: Diagnosis not present

## 2016-07-23 ENCOUNTER — Telehealth: Payer: Self-pay | Admitting: Emergency Medicine

## 2016-07-23 DIAGNOSIS — R0681 Apnea, not elsewhere classified: Secondary | ICD-10-CM

## 2016-07-23 NOTE — Telephone Encounter (Signed)
Pt called and would like an order placed for a sleep study. Dr Linna Darner ordered one for her last year but she never called to schedule. She states she husband is telling her she is obstructing at night while snoring. Please advise.

## 2016-07-23 NOTE — Telephone Encounter (Signed)
Referral ordered for pulm - they will evaluate her and then arrange sleep study

## 2016-09-03 DIAGNOSIS — Z1151 Encounter for screening for human papillomavirus (HPV): Secondary | ICD-10-CM | POA: Diagnosis not present

## 2016-09-03 DIAGNOSIS — Z6825 Body mass index (BMI) 25.0-25.9, adult: Secondary | ICD-10-CM | POA: Diagnosis not present

## 2016-09-03 DIAGNOSIS — D069 Carcinoma in situ of cervix, unspecified: Secondary | ICD-10-CM | POA: Diagnosis not present

## 2016-09-03 DIAGNOSIS — Z01419 Encounter for gynecological examination (general) (routine) without abnormal findings: Secondary | ICD-10-CM | POA: Diagnosis not present

## 2016-09-09 ENCOUNTER — Other Ambulatory Visit: Payer: Self-pay | Admitting: Pain Medicine

## 2016-09-09 ENCOUNTER — Encounter: Payer: Self-pay | Admitting: Pulmonary Disease

## 2016-09-09 ENCOUNTER — Ambulatory Visit (INDEPENDENT_AMBULATORY_CARE_PROVIDER_SITE_OTHER): Payer: BLUE CROSS/BLUE SHIELD | Admitting: Pulmonary Disease

## 2016-09-09 VITALS — BP 108/62 | HR 85 | Ht 64.0 in | Wt 148.0 lb

## 2016-09-09 DIAGNOSIS — R0683 Snoring: Secondary | ICD-10-CM | POA: Diagnosis not present

## 2016-09-09 NOTE — Progress Notes (Signed)
   Subjective:    Patient ID: Nicole Dunn, female    DOB: 1966-02-17, 51 y.o.   MRN: [id number]  HPI    Review of Systems  Constitutional: Negative for fever and unexpected weight change.  HENT: Positive for congestion. Negative for dental problem, ear pain, nosebleeds, postnasal drip, rhinorrhea, sinus pressure, sneezing, sore throat and trouble swallowing.   Eyes: Negative for redness and itching.  Respiratory: Negative for cough, chest tightness, shortness of breath and wheezing.   Cardiovascular: Negative for palpitations and leg swelling.  Gastrointestinal: Negative for nausea and vomiting.       Acid heartburn  Genitourinary: Negative for dysuria.  Musculoskeletal: Positive for arthralgias. Negative for joint swelling.  Skin: Negative for rash.  Neurological: Negative for headaches.  Hematological: Does not bruise/bleed easily.  Psychiatric/Behavioral: Positive for dysphoric mood. The patient is nervous/anxious.        Objective:   Physical Exam        Assessment & Plan:

## 2016-09-09 NOTE — Patient Instructions (Signed)
Call when you are ready to set up sleep study  Follow up in 4 months

## 2016-09-09 NOTE — Progress Notes (Signed)
Past Surgical History She  has a past surgical history that includes Tonsillectomy; Endoscopic plantar fasciotomy (2011); otic tubes; Meniscus repair (2012); Endometrial biopsy (05/20/2011); and Total abdominal hysterectomy (05/2011).  Allergies  Allergen Reactions  . Rocephin [Ceftriaxone Sodium In Dextrose] Rash    Redness of face, trunk , UE  . Escitalopram Oxalate     Sexual dysfunction  . Topiramate     Mood change, anger    Family History Her family history includes Breast cancer in her mother; Clotting disorder in her paternal grandfather; Deep vein thrombosis in her father; Depression in her other; Heart attack in her maternal grandmother; Hypertension in her father and mother; Kidney failure in her mother; Liver disease in her mother; OCD in her mother; Peripheral vascular disease in her brother; Pulmonary embolism in her father and paternal grandfather.  Social History She  reports that she quit smoking about 10 years ago. Her smoking use included Cigarettes. She has a 34.32 pack-year smoking history. She has never used smokeless tobacco. She reports that she drinks alcohol. She reports that she does not use drugs.  Review of systems Constitutional: Negative for fever and unexpected weight change.  HENT: Positive for congestion. Negative for dental problem, ear pain, nosebleeds, postnasal drip, rhinorrhea, sinus pressure, sneezing, sore throat and trouble swallowing.   Eyes: Negative for redness and itching.  Respiratory: Negative for cough, chest tightness, shortness of breath and wheezing.   Cardiovascular: Negative for palpitations and leg swelling.  Gastrointestinal: Negative for nausea and vomiting.       Acid heartburn  Genitourinary: Negative for dysuria.  Musculoskeletal: Positive for arthralgias. Negative for joint swelling.  Skin: Negative for rash.  Neurological: Negative for headaches.  Hematological: Does not bruise/bleed easily.  Psychiatric/Behavioral: Positive  for dysphoric mood. The patient is nervous/anxious.     Current Outpatient Prescriptions on File Prior to Visit  Medication Sig  . AMBULATORY NON FORMULARY MEDICATION Topical Cream: ? Name (Rx;ed by Neurologist)  . aspirin 81 MG tablet Take 81 mg by mouth daily.  Marland Kitchen buPROPion (WELLBUTRIN XL) 300 MG 24 hr tablet Take 1 tablet (300 mg total) by mouth daily. Must transition care to new provider for refills  . busPIRone (BUSPAR) 5 MG tablet Take 5 mg by mouth 2 (two) times daily.   Marland Kitchen gabapentin (NEURONTIN) 300 MG capsule 600 mg in am 900 mg pm  . Hydrocodone-Acetaminophen (VICODIN ES PO) Take by mouth. 7.5-325  1 by mouth every 8 hours as needed, rx'ed by Dr.Dalton-Bethea  . Magnesium 400 MG TABS Take by mouth.  . mirtazapine (REMERON SOL-TAB) 15 MG disintegrating tablet   . Probiotic Product (SOLUBLE FIBER/PROBIOTICS PO) Take by mouth.  . propranolol (INDERAL) 10 MG tablet TAKE 2 TABLET BY MOUTH EVERY 8-12 HOURS AS NEEDED  . ranitidine (ZANTAC) 150 MG capsule Take 150 mg by mouth daily.    No current facility-administered medications on file prior to visit.     Chief Complaint  Patient presents with  . SLEEP CONSULT    Referred by Dr Quay Burow. Epworth Score: 4    Past medical history She  has a past medical history of Anxiety; Common migraine; Degenerative disc disease; Degenerative disc disease, lumbar; Endometrial hyperplasia (07/10/2011); GERD (gastroesophageal reflux disease); Hypertriglyceridemia (2011); and Nonspecific elevation of levels of transaminase or lactic acid dehydrogenase (LDH) (2011).  Vital signs BP 108/62 (BP Location: Left Arm, Cuff Size: Normal)   Pulse 85   Ht 5\' 4"  (1.626 m)   Wt 148 lb (67.1 kg)  SpO2 98%   BMI 25.40 kg/m   History of Present Illness Nicole Dunn is a 51 y.o. female for evaluation of sleep problems.  Her husband has been worried about her snoring.  She will stop breathing while asleep.  She will wake up feeling choked, especially on her  back.  Her mouth gets dry.  She doesn't dream much.  She is a restless sleeper.  She goes to sleep at 11 pm.  She falls asleep after 1 hour.  She wakes up some times to use the bathroom.  She gets out of bed at 530 am.  She feels tired in the morning.  She has been getting headaches more frequently during the day.  She takes neurontin and remeron at night.  She isn't using anything to help stay awake.  She works as a Statistician at [employer]  Her mother has sleep apnea.  She denies sleep walking, sleep talking, bruxism, or nightmares.  There is no history of restless legs.  She denies sleep hallucinations, sleep paralysis, or cataplexy.  The Epworth score is 4 out of 24.   Physical Exam:  General - No distress ENT - No sinus tenderness, no oral exudate, no LAN, no thyromegaly, TM clear, pupils equal/reactive, MP 3, low laying soft palate, scalloped tongue Cardiac - s1s2 regular, no murmur, pulses symmetric Chest - No wheeze/rales/dullness, good air entry, normal respiratory excursion Back - No focal tenderness Abd - Soft, non-tender, no organomegaly, + bowel sounds Ext - No edema Neuro - Normal strength, cranial nerves intact Skin - No rashes Psych - Normal mood, and behavior  Discussion: She has snoring, sleep disruption, apnea, and daytime sleepiness.  Her mother has sleep apnea.  She has narrow upper airway.  I am concerned she could have sleep apnea.  We discussed how sleep apnea can affect various health problems, including risks for hypertension, cardiovascular disease, and diabetes.  We also discussed how sleep disruption can increase risks for accidents, such as while driving.  Weight loss as a means of improving sleep apnea was also reviewed.  Additional treatment options discussed were CPAP therapy, oral appliance, and surgical intervention.  Assessment/plan:  Snoring with concern for obstructive sleep apnea. - she will eventually need a sleep study to further  assess - she would like to discuss with her husband about out of pocket expense for in lab sleep study before arranging for this  - she will call when she is ready to do sleep study   Patient Instructions  Call when you are ready to set up sleep study  Follow up in 4 months    Chesley Mires, M.D. Pager [phone] 09/09/2016, 2:45 PM

## 2016-09-10 ENCOUNTER — Other Ambulatory Visit: Payer: Self-pay | Admitting: Obstetrics and Gynecology

## 2016-09-10 DIAGNOSIS — N6001 Solitary cyst of right breast: Secondary | ICD-10-CM

## 2016-09-25 DIAGNOSIS — F411 Generalized anxiety disorder: Secondary | ICD-10-CM | POA: Diagnosis not present

## 2016-09-25 DIAGNOSIS — F331 Major depressive disorder, recurrent, moderate: Secondary | ICD-10-CM | POA: Diagnosis not present

## 2016-10-09 DIAGNOSIS — G894 Chronic pain syndrome: Secondary | ICD-10-CM | POA: Diagnosis not present

## 2016-10-09 DIAGNOSIS — Z79891 Long term (current) use of opiate analgesic: Secondary | ICD-10-CM | POA: Diagnosis not present

## 2016-10-14 ENCOUNTER — Other Ambulatory Visit: Payer: BLUE CROSS/BLUE SHIELD

## 2016-10-19 ENCOUNTER — Other Ambulatory Visit: Payer: Self-pay | Admitting: Internal Medicine

## 2016-10-21 ENCOUNTER — Ambulatory Visit
Admission: RE | Admit: 2016-10-21 | Discharge: 2016-10-21 | Disposition: A | Discharge status: Home / Self Care | Payer: BLUE CROSS/BLUE SHIELD | Source: Ambulatory Visit | Attending: Obstetrics and Gynecology | Admitting: Obstetrics and Gynecology

## 2016-10-21 DIAGNOSIS — R922 Inconclusive mammogram: Secondary | ICD-10-CM | POA: Diagnosis not present

## 2016-10-21 DIAGNOSIS — N6001 Solitary cyst of right breast: Secondary | ICD-10-CM

## 2016-10-27 DIAGNOSIS — G43909 Migraine, unspecified, not intractable, without status migrainosus: Secondary | ICD-10-CM | POA: Diagnosis not present

## 2016-10-31 DIAGNOSIS — R52 Pain, unspecified: Secondary | ICD-10-CM | POA: Diagnosis not present

## 2016-10-31 DIAGNOSIS — J111 Influenza due to unidentified influenza virus with other respiratory manifestations: Secondary | ICD-10-CM | POA: Diagnosis not present

## 2016-11-04 DIAGNOSIS — G894 Chronic pain syndrome: Secondary | ICD-10-CM | POA: Diagnosis not present

## 2016-11-04 DIAGNOSIS — M542 Cervicalgia: Secondary | ICD-10-CM | POA: Diagnosis not present

## 2016-11-04 DIAGNOSIS — M5031 Other cervical disc degeneration,  high cervical region: Secondary | ICD-10-CM | POA: Diagnosis not present

## 2016-11-04 DIAGNOSIS — Z79891 Long term (current) use of opiate analgesic: Secondary | ICD-10-CM | POA: Diagnosis not present

## 2016-12-02 DIAGNOSIS — M5031 Other cervical disc degeneration,  high cervical region: Secondary | ICD-10-CM | POA: Diagnosis not present

## 2016-12-02 DIAGNOSIS — G894 Chronic pain syndrome: Secondary | ICD-10-CM | POA: Diagnosis not present

## 2016-12-02 DIAGNOSIS — M542 Cervicalgia: Secondary | ICD-10-CM | POA: Diagnosis not present

## 2016-12-02 DIAGNOSIS — Z79891 Long term (current) use of opiate analgesic: Secondary | ICD-10-CM | POA: Diagnosis not present

## 2016-12-18 DIAGNOSIS — F411 Generalized anxiety disorder: Secondary | ICD-10-CM | POA: Diagnosis not present

## 2016-12-18 DIAGNOSIS — F331 Major depressive disorder, recurrent, moderate: Secondary | ICD-10-CM | POA: Diagnosis not present

## 2016-12-30 DIAGNOSIS — G894 Chronic pain syndrome: Secondary | ICD-10-CM | POA: Diagnosis not present

## 2016-12-30 DIAGNOSIS — Z79891 Long term (current) use of opiate analgesic: Secondary | ICD-10-CM | POA: Diagnosis not present

## 2016-12-30 DIAGNOSIS — M5031 Other cervical disc degeneration,  high cervical region: Secondary | ICD-10-CM | POA: Diagnosis not present

## 2016-12-30 DIAGNOSIS — M542 Cervicalgia: Secondary | ICD-10-CM | POA: Diagnosis not present

## 2017-01-08 ENCOUNTER — Ambulatory Visit: Payer: BLUE CROSS/BLUE SHIELD | Admitting: Pulmonary Disease

## 2017-02-25 ENCOUNTER — Other Ambulatory Visit: Payer: Self-pay | Admitting: Internal Medicine

## 2017-03-11 ENCOUNTER — Encounter: Payer: Self-pay | Admitting: Podiatry

## 2017-03-11 ENCOUNTER — Ambulatory Visit (INDEPENDENT_AMBULATORY_CARE_PROVIDER_SITE_OTHER): Payer: BLUE CROSS/BLUE SHIELD | Admitting: Podiatry

## 2017-03-11 ENCOUNTER — Ambulatory Visit (INDEPENDENT_AMBULATORY_CARE_PROVIDER_SITE_OTHER): Payer: BLUE CROSS/BLUE SHIELD

## 2017-03-11 DIAGNOSIS — M722 Plantar fascial fibromatosis: Secondary | ICD-10-CM | POA: Diagnosis not present

## 2017-03-11 DIAGNOSIS — M779 Enthesopathy, unspecified: Secondary | ICD-10-CM

## 2017-03-11 MED ORDER — TRIAMCINOLONE ACETONIDE 10 MG/ML IJ SUSP
10.0000 mg | Freq: Once | INTRAMUSCULAR | Status: AC
  Administered 2017-03-11: 10 mg

## 2017-03-11 NOTE — Progress Notes (Signed)
Subjective:    Patient ID: Nicole Dunn, female   DOB: 51 y.o.   MRN: [id number]   HPI patient states that she is started to develop a lot of pain in her right heel and the left big toe joint has been sore and she does not remember specific injury and states it's been occurring for several months    ROS      Objective:  Physical Exam neurovascular status intact with patient found have exquisite discomfort right plantar heel insertional point tendon calcaneus and the left first MPJ mostly on the medial side is inflamed with fluid buildup     Assessment:    Acute plantar fasciitis right with capsulitis left     Plan:    H&P x-rays of both feet reviewed and today I injected the plantar fascial right 3 mg Kenalog 5 mill grams Xylocaine and applied fascial brace right and for the left I went ahead and injected the dorsal medial capsule 3 mg Kenalog 5 mill grams Xylocaine. Patient will be seen back for [place] to reevaluate again in the next several weeks and we will utilize brace till that time  X-rays indicate that there is no indications of advanced arthritis of the MPJ with mild bunion deformity and spur formation with no indication of stress fracture

## 2017-03-11 NOTE — Patient Instructions (Signed)

## 2017-03-12 DIAGNOSIS — F331 Major depressive disorder, recurrent, moderate: Secondary | ICD-10-CM | POA: Diagnosis not present

## 2017-03-12 DIAGNOSIS — F411 Generalized anxiety disorder: Secondary | ICD-10-CM | POA: Diagnosis not present

## 2017-03-26 ENCOUNTER — Ambulatory Visit: Payer: BLUE CROSS/BLUE SHIELD | Admitting: Podiatry

## 2017-04-03 ENCOUNTER — Ambulatory Visit: Payer: BLUE CROSS/BLUE SHIELD | Admitting: Podiatry

## 2017-05-07 ENCOUNTER — Other Ambulatory Visit: Payer: Self-pay | Admitting: Internal Medicine

## 2017-05-21 ENCOUNTER — Encounter: Payer: Self-pay | Admitting: Podiatry

## 2017-05-21 ENCOUNTER — Ambulatory Visit (INDEPENDENT_AMBULATORY_CARE_PROVIDER_SITE_OTHER): Payer: BLUE CROSS/BLUE SHIELD | Admitting: Podiatry

## 2017-05-21 DIAGNOSIS — M79675 Pain in left toe(s): Secondary | ICD-10-CM

## 2017-05-21 DIAGNOSIS — M779 Enthesopathy, unspecified: Secondary | ICD-10-CM | POA: Diagnosis not present

## 2017-05-21 MED ORDER — DICLOFENAC SODIUM 75 MG PO TBEC: 75.0000 mg | DELAYED_RELEASE_TABLET | Freq: Two times a day (BID) | ORAL | 2 refills | Status: DC

## 2017-05-21 NOTE — Progress Notes (Signed)
Subjective:    Patient ID: Nicole Dunn, female   DOB: 51 y.o.   MRN: [id number]   HPI patient states she still has a lot of pain around her big toe joint left and states that when she tries to move it it's sore and has trouble wearing shoes    ROS      Objective:  Physical Exam neurovascular status intact with inflammation pain around the first MPJ left that's localized with mild inflammation and quite a bit of discomfort with palpation. There is no crepitus of the joint or loss of motion     Assessment:    Acute inflammatory process around the first MPJ left that did not respond to previous conservative treatment     Plan:    H&P reviewed condition and recommended a short air fracture walker in order to try to reduce all weightbearing forces on the joint surface. Patient is placed an air fracture walker and also oral anti-inflammatory and will be seen back to recheck

## 2017-05-24 ENCOUNTER — Encounter: Payer: Self-pay | Admitting: Internal Medicine

## 2017-05-24 DIAGNOSIS — R739 Hyperglycemia, unspecified: Secondary | ICD-10-CM | POA: Insufficient documentation

## 2017-05-24 NOTE — Patient Instructions (Addendum)
Test(s) ordered today. Your results will be released to [place] (or called to you) after review, usually within 72hours after test completion. If any changes need to be made, you will be notified at that same time.  All other Health Maintenance issues reviewed.   All recommended immunizations and age-appropriate screenings are up-to-date or discussed.  No immunizations administered today.   Medications reviewed and updated.  No changes recommended at this time.  Your prescription(s) have been submitted to your pharmacy. Please take as directed and contact our office if you believe you are having problem(s) with the medication(s).   Please followup in one year for a physical   Health Maintenance, Female Adopting a healthy lifestyle and getting preventive care can go a long way to promote health and wellness. Talk with your health care provider about what schedule of regular examinations is right for you. This is a good chance for you to check in with your provider about disease prevention and staying healthy. In between checkups, there are plenty of things you can do on your own. Experts have done a lot of research about which lifestyle changes and preventive measures are most likely to keep you healthy. Ask your health care provider for more information. Weight and diet Eat a healthy diet  Be sure to include plenty of vegetables, fruits, low-fat dairy products, and lean protein.  Do not eat a lot of foods high in solid fats, added sugars, or salt.  Get regular exercise. This is one of the most important things you can do for your health. ? Most adults should exercise for at least 150 minutes each week. The exercise should increase your heart rate and make you sweat (moderate-intensity exercise). ? Most adults should also do strengthening exercises at least twice a week. This is in addition to the moderate-intensity exercise.  Maintain a healthy weight  Body mass index (BMI) is a  measurement that can be used to identify possible weight problems. It estimates body fat based on height and weight. Your health care provider can help determine your BMI and help you achieve or maintain a healthy weight.  For females 22 years of age and older: ? A BMI below 18.5 is considered underweight. ? A BMI of 18.5 to 24.9 is normal. ? A BMI of 25 to 29.9 is considered overweight. ? A BMI of 30 and above is considered obese.  Watch levels of cholesterol and blood lipids  You should start having your blood tested for lipids and cholesterol at 51 years of age, then have this test every 5 years.  You may need to have your cholesterol levels checked more often if: ? Your lipid or cholesterol levels are high. ? You are older than 51 years of age. ? You are at high risk for heart disease.  Cancer screening Lung Cancer  Lung cancer screening is recommended for adults 50-56 years old who are at high risk for lung cancer because of a history of smoking.  A yearly low-dose CT scan of the lungs is recommended for people who: ? Currently smoke. ? Have quit within the past 15 years. ? Have at least a 30-pack-year history of smoking. A pack year is smoking an average of one pack of cigarettes a day for 1 year.  Yearly screening should continue until it has been 15 years since you quit.  Yearly screening should stop if you develop a health problem that would prevent you from having lung cancer treatment.  Breast Cancer  Practice breast self-awareness. This means understanding how your breasts normally appear and feel.  It also means doing regular breast self-exams. Let your health care provider know about any changes, no matter how small.  If you are in your 20s or 30s, you should have a clinical breast exam (CBE) by a health care provider every 1-3 years as part of a regular health exam.  If you are 40 or older, have a CBE every year. Also consider having a breast X-ray (mammogram)  every year.  If you have a family history of breast cancer, talk to your health care provider about genetic screening.  If you are at high risk for breast cancer, talk to your health care provider about having an MRI and a mammogram every year.  Breast cancer gene (BRCA) assessment is recommended for women who have family members with BRCA-related cancers. BRCA-related cancers include: ? Breast. ? Ovarian. ? Tubal. ? Peritoneal cancers.  Results of the assessment will determine the need for genetic counseling and BRCA1 and BRCA2 testing.  Cervical Cancer Your health care provider may recommend that you be screened regularly for cancer of the pelvic organs (ovaries, uterus, and vagina). This screening involves a pelvic examination, including checking for microscopic changes to the surface of your cervix (Pap test). You may be encouraged to have this screening done every 3 years, beginning at age 21.  For women ages 30-65, health care providers may recommend pelvic exams and Pap testing every 3 years, or they may recommend the Pap and pelvic exam, combined with testing for human papilloma virus (HPV), every 5 years. Some types of HPV increase your risk of cervical cancer. Testing for HPV may also be done on women of any age with unclear Pap test results.  Other health care providers may not recommend any screening for nonpregnant women who are considered low risk for pelvic cancer and who do not have symptoms. Ask your health care provider if a screening pelvic exam is right for you.  If you have had past treatment for cervical cancer or a condition that could lead to cancer, you need Pap tests and screening for cancer for at least 20 years after your treatment. If Pap tests have been discontinued, your risk factors (such as having a new sexual partner) need to be reassessed to determine if screening should resume. Some women have medical problems that increase the chance of getting cervical  cancer. In these cases, your health care provider may recommend more frequent screening and Pap tests.  Colorectal Cancer  This type of cancer can be detected and often prevented.  Routine colorectal cancer screening usually begins at 50 years of age and continues through 51 years of age.  Your health care provider may recommend screening at an earlier age if you have risk factors for colon cancer.  Your health care provider may also recommend using home test kits to check for hidden blood in the stool.  A small camera at the end of a tube can be used to examine your colon directly (sigmoidoscopy or colonoscopy). This is done to check for the earliest forms of colorectal cancer.  Routine screening usually begins at age 50.  Direct examination of the colon should be repeated every 5-10 years through 51 years of age. However, you may need to be screened more often if early forms of precancerous polyps or small growths are found.  Skin Cancer  Check your skin from head to toe regularly.  Tell your health care   provider about any new moles or changes in moles, especially if there is a change in a mole's shape or color.  Also tell your health care provider if you have a mole that is larger than the size of a pencil eraser.  Always use sunscreen. Apply sunscreen liberally and repeatedly throughout the day.  Protect yourself by wearing long sleeves, pants, a wide-brimmed hat, and sunglasses whenever you are outside.  Heart disease, diabetes, and high blood pressure  High blood pressure causes heart disease and increases the risk of stroke. High blood pressure is more likely to develop in: ? People who have blood pressure in the high end of the normal range (130-139/85-89 mm Hg). ? People who are overweight or obese. ? People who are African American.  If you are 18-39 years of age, have your blood pressure checked every 3-5 years. If you are 40 years of age or older, have your blood  pressure checked every year. You should have your blood pressure measured twice-once when you are at a hospital or clinic, and once when you are not at a hospital or clinic. Record the average of the two measurements. To check your blood pressure when you are not at a hospital or clinic, you can use: ? An automated blood pressure machine at a pharmacy. ? A home blood pressure monitor.  If you are between 55 years and 79 years old, ask your health care provider if you should take aspirin to prevent strokes.  Have regular diabetes screenings. This involves taking a blood sample to check your fasting blood sugar level. ? If you are at a normal weight and have a low risk for diabetes, have this test once every three years after 51 years of age. ? If you are overweight and have a high risk for diabetes, consider being tested at a younger age or more often. Preventing infection Hepatitis B  If you have a higher risk for hepatitis B, you should be screened for this virus. You are considered at high risk for hepatitis B if: ? You were born in a country where hepatitis B is common. Ask your health care provider which countries are considered high risk. ? Your parents were born in a high-risk country, and you have not been immunized against hepatitis B (hepatitis B vaccine). ? You have HIV or AIDS. ? You use needles to inject street drugs. ? You live with someone who has hepatitis B. ? You have had sex with someone who has hepatitis B. ? You get hemodialysis treatment. ? You take certain medicines for conditions, including cancer, organ transplantation, and autoimmune conditions.  Hepatitis C  Blood testing is recommended for: ? Everyone born from 1945 through 1965. ? Anyone with known risk factors for hepatitis C.  Sexually transmitted infections (STIs)  You should be screened for sexually transmitted infections (STIs) including gonorrhea and chlamydia if: ? You are sexually active and are  younger than 51 years of age. ? You are older than 51 years of age and your health care provider tells you that you are at risk for this type of infection. ? Your sexual activity has changed since you were last screened and you are at an increased risk for chlamydia or gonorrhea. Ask your health care provider if you are at risk.  If you do not have HIV, but are at risk, it may be recommended that you take a prescription medicine daily to prevent HIV infection. This is called pre-exposure prophylaxis (PrEP). You   are considered at risk if: ? You are sexually active and do not regularly use condoms or know the HIV status of your partner(s). ? You take drugs by injection. ? You are sexually active with a partner who has HIV.  Talk with your health care provider about whether you are at high risk of being infected with HIV. If you choose to begin PrEP, you should first be tested for HIV. You should then be tested every 3 months for as long as you are taking PrEP. Pregnancy  If you are premenopausal and you may become pregnant, ask your health care provider about preconception counseling.  If you may become pregnant, take 400 to 800 micrograms (mcg) of folic acid every day.  If you want to prevent pregnancy, talk to your health care provider about birth control (contraception). Osteoporosis and menopause  Osteoporosis is a disease in which the bones lose minerals and strength with aging. This can result in serious bone fractures. Your risk for osteoporosis can be identified using a bone density scan.  If you are 65 years of age or older, or if you are at risk for osteoporosis and fractures, ask your health care provider if you should be screened.  Ask your health care provider whether you should take a calcium or vitamin D supplement to lower your risk for osteoporosis.  Menopause may have certain physical symptoms and risks.  Hormone replacement therapy may reduce some of these symptoms and  risks. Talk to your health care provider about whether hormone replacement therapy is right for you. Follow these instructions at home:  Schedule regular health, dental, and eye exams.  Stay current with your immunizations.  Do not use any tobacco products including cigarettes, chewing tobacco, or electronic cigarettes.  If you are pregnant, do not drink alcohol.  If you are breastfeeding, limit how much and how often you drink alcohol.  Limit alcohol intake to no more than 1 drink per day for nonpregnant women. One drink equals 12 ounces of beer, 5 ounces of wine, or 1 ounces of hard liquor.  Do not use street drugs.  Do not share needles.  Ask your health care provider for help if you need support or information about quitting drugs.  Tell your health care provider if you often feel depressed.  Tell your health care provider if you have ever been abused or do not feel safe at home. This information is not intended to replace advice given to you by your health care provider. Make sure you discuss any questions you have with your health care provider. Document Released: 02/10/2011 Document Revised: 01/03/2016 Document Reviewed: 05/01/2015 [employer] Interactive Patient Education  2018 [employer]  

## 2017-05-24 NOTE — Progress Notes (Signed)
Subjective:    Patient ID: Nicole Dunn, female    DOB: 1966/01/13, 51 y.o.   MRN: [id number]  HPI She is here for a physical exam.   She is following with a psychiatrist. One of her medications was changed in overall she feels she is doing well. She has no concerns or questions.  Medications and allergies reviewed with patient and updated if appropriate.  Patient Active Problem List   Diagnosis Date Noted  . High triglycerides 05/27/2017  . Hyperglycemia 05/24/2017  . PVC (premature ventricular contraction) 07/29/2011  . 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 Anxiety state 12/28/2008  . GERD 12/28/2008  . Migraine without aura 07/23/2007  . CERVICAL RADICULOPATHY 07/23/2007  . Lumbar pain 07/23/2007    Current Outpatient Prescriptions on File Prior to Visit  Medication Sig Dispense Refill  . AMBULATORY NON FORMULARY MEDICATION Topical Cream: ? Name (Rx;ed by Neurologist)    . aspirin 81 MG tablet Take 81 mg by mouth daily.    Marland Kitchen buPROPion (WELLBUTRIN XL) 300 MG 24 hr tablet Take 1 tablet (300 mg total) by mouth daily. Must transition care to new provider for refills 90 tablet 0  . busPIRone (BUSPAR) 5 MG tablet Take 5 mg by mouth 2 (two) times daily.     . Cholecalciferol (VITAMIN D3) 5000 units CAPS Take 1 capsule by mouth daily.    . diclofenac (VOLTAREN) 75 MG EC tablet Take 1 tablet (75 mg total) by mouth 2 (two) times daily. 50 tablet 2  . gabapentin (NEURONTIN) 300 MG capsule 600 mg in am 900 mg pm    . Hydrocodone-Acetaminophen (VICODIN ES PO) Take by mouth. 7.5-325  1 by mouth every 8 hours as needed, rx'ed by Dr.Dalton-Bethea    . Magnesium 400 MG TABS Take by mouth.    . Probiotic Product (SOLUBLE FIBER/PROBIOTICS PO) Take by mouth.    . propranolol (INDERAL) 10 MG tablet Take 2 tablets by mouth every 8-12 hours as needed. NEED OV FOR REFILLS 90 tablet 0  . ranitidine (ZANTAC) 150 MG capsule Take 150 mg by mouth daily.      No current  facility-administered medications on file prior to visit.     Past Medical History:  Diagnosis Date  . Anxiety   . Common migraine    with prodrome visual auro, "black spot with blind peripheral ring"  . Degenerative disc disease    with C6-7 encroachment & C3-4 narrowing  . Degenerative disc disease, lumbar    L5-S1, Dr. Niel Hummer, Neurology  . Endometrial hyperplasia 07/10/2011   Dr Lennie Muckle  . GERD (gastroesophageal reflux disease)   . Hypertriglyceridemia 2011   TG 176; HDL 58.9; LDL 121.7  . Nonspecific elevation of levels of transaminase or lactic acid dehydrogenase (LDH) 2011    ALT 61    Past Surgical History:  Procedure Laterality Date  . ENDOMETRIAL BIOPSY  05/20/2011   Dr Pamala Hurry   . ENDOSCOPIC PLANTAR FASCIOTOMY  2011   left foot, Dr. Paulla Dolly  . MENISCUS REPAIR  2012   Dr  Noemi Chapel  . otic tubes     bilaterally @age  4  . TONSILLECTOMY    . TOTAL ABDOMINAL HYSTERECTOMY  05/2011   Dr Nancy Marus, [facility]    Social History   Social History  . Marital status: Married    Spouse name: N/A  . Number of children: N/A  . Years of education: N/A   Occupational History  . Respiratory Therapist  Social History Main Topics  . Smoking status: Former Smoker    Packs/day: 2.86    Years: 12.00    Types: Cigarettes    Quit date: 08/11/2006  . Smokeless tobacco: Never Used     Comment: smoked 1983-2000, up to 1/2 ppd  . Alcohol use Yes     Comment: rarely  . Drug use: No  . Sexual activity: Yes   Other Topics Concern  . Not on file   Social History Narrative   exercises    Family History  Problem Relation Age of Onset  . Hypertension Father   . Deep vein thrombosis Father   . Pulmonary embolism Father   . Hypertension Mother   . Kidney failure Mother        transitory/iatrogenic  . Liver disease Mother        hepatic insufficiency  . OCD Mother   . Breast cancer Mother   . Peripheral vascular disease Brother   . Clotting disorder  Paternal Grandfather   . Pulmonary embolism Paternal Grandfather   . Bipolar disorder Unknown        sister & brother  . Depression Unknown        sister & brother  . Heart attack Maternal Grandmother        in 35s  . Depression Other   . Asthma Neg Hx   . Diabetes Neg Hx   . Stroke Neg Hx     Review of Systems  Constitutional: Negative for appetite change, chills, fatigue, fever and unexpected weight change.       Hot/cold, hotflashes  Eyes: Negative for visual disturbance.  Respiratory: Negative for cough, shortness of breath and wheezing.   Cardiovascular: Negative for chest pain, palpitations and leg swelling.  Gastrointestinal: Positive for constipation (variable - IBS) and diarrhea (variable - IBS). Negative for abdominal pain, blood in stool and nausea.       GERD controlled  Genitourinary: Negative for dysuria and hematuria.  Musculoskeletal: Positive for arthralgias (hips from lower back, shoulder bursitis), back pain and neck pain (chronic, pain management - controlled).  Skin: Negative for color change and rash.  Neurological: Positive for headaches. Negative for light-headedness.  Psychiatric/Behavioral: Positive for sleep disturbance. Negative for dysphoric mood. The patient is nervous/anxious.        Objective:   Vitals:   05/27/17 1348  BP: 120/78  Pulse: 78  Resp: 16  Temp: 97.9 F (36.6 C)  SpO2: 99%   Filed Weights   05/27/17 1348  Weight: 145 lb (65.8 kg)   Body mass index is 24.89 kg/m.  Wt Readings from Last 3 Encounters:  05/27/17 145 lb (65.8 kg)  09/09/16 148 lb (67.1 kg)  04/07/16 148 lb (67.1 kg)     Physical Exam Constitutional: She appears well-developed and well-nourished. No distress.  HENT:  Head: Normocephalic and atraumatic.  Right Ear: External ear normal. Normal ear canal and TM Left Ear: External ear normal.  Normal ear canal and TM Mouth/Throat: Oropharynx is clear and moist.  Eyes: Conjunctivae and EOM are normal.    Neck: Neck supple. No tracheal deviation present. No thyromegaly present.  No carotid bruit  Cardiovascular: Normal rate, regular rhythm and normal heart sounds.   2/6 systolic murmur heard.  No edema. Pulmonary/Chest: Effort normal and breath sounds normal. No respiratory distress. She has no wheezes. She has no rales.  Breast: deferred to Gyn Abdominal: Soft. She exhibits no distension. There is no tenderness.  Lymphadenopathy: She has no  cervical adenopathy.  Skin: Skin is warm and dry. She is not diaphoretic.  Psychiatric: She has a normal mood and affect. Her behavior is normal.        Assessment & Plan:   Physical exam: Screening blood work  ordered Immunizations   Td up to date,   Colonoscopy never had one - discussed colonoscopy Or possible Cologuard-she will check with her insurance to see what the cost is  Mammogram  Up to date  Gyn   Due - will schedule  Eye exams  Up to date  EKG   Last done 2014 Exercise active at work, averages 5000-10000 steps a day; no other exercise Weight  Normal BMI Skin   No concerns Substance abuse    none  See Problem List for Assessment and Plan of chronic medical problems.  Follow-up annually

## 2017-05-27 ENCOUNTER — Ambulatory Visit (INDEPENDENT_AMBULATORY_CARE_PROVIDER_SITE_OTHER): Payer: BLUE CROSS/BLUE SHIELD | Admitting: Internal Medicine

## 2017-05-27 ENCOUNTER — Encounter: Payer: Self-pay | Admitting: Internal Medicine

## 2017-05-27 ENCOUNTER — Other Ambulatory Visit (INDEPENDENT_AMBULATORY_CARE_PROVIDER_SITE_OTHER): Payer: BLUE CROSS/BLUE SHIELD

## 2017-05-27 VITALS — BP 120/78 | HR 78 | Temp 97.9°F | Resp 16 | Ht 64.0 in | Wt 145.0 lb

## 2017-05-27 DIAGNOSIS — D069 Carcinoma in situ of cervix, unspecified: Secondary | ICD-10-CM | POA: Diagnosis not present

## 2017-05-27 DIAGNOSIS — Z0000 Encounter for general adult medical examination without abnormal findings: Secondary | ICD-10-CM | POA: Diagnosis not present

## 2017-05-27 DIAGNOSIS — E781 Pure hyperglyceridemia: Secondary | ICD-10-CM | POA: Diagnosis not present

## 2017-05-27 DIAGNOSIS — K219 Gastro-esophageal reflux disease without esophagitis: Secondary | ICD-10-CM

## 2017-05-27 DIAGNOSIS — G43009 Migraine without aura, not intractable, without status migrainosus: Secondary | ICD-10-CM

## 2017-05-27 DIAGNOSIS — R739 Hyperglycemia, unspecified: Secondary | ICD-10-CM

## 2017-05-27 LAB — COMPREHENSIVE METABOLIC PANEL
ALBUMIN: 4.4 g/dL (ref 3.5–5.2)
ALK PHOS: 50 U/L (ref 39–117)
ALT: 29 U/L (ref 0–35)
AST: 23 U/L (ref 0–37)
BUN: 20 mg/dL (ref 6–23)
CO2: 26 mEq/L (ref 19–32)
CREATININE: 1.1 mg/dL (ref 0.40–1.20)
Calcium: 9.4 mg/dL (ref 8.4–10.5)
Chloride: 105 mEq/L (ref 96–112)
GFR: 55.66 mL/min — ABNORMAL LOW (ref >60.00)
GLUCOSE: 96 mg/dL (ref 70–99)
POTASSIUM: 4.1 meq/L (ref 3.5–5.1)
SODIUM: 139 meq/L (ref 135–145)
TOTAL PROTEIN: 6.8 g/dL (ref 6.0–8.3)
Total Bilirubin: 0.3 mg/dL (ref 0.2–1.2)

## 2017-05-27 LAB — CBC WITH DIFFERENTIAL/PLATELET
BASOS PCT: 0.6 % (ref 0.0–3.0)
Basophils Absolute: 0 10*3/uL (ref 0.0–0.1)
EOS ABS: 0.1 10*3/uL (ref 0.0–0.7)
Eosinophils Relative: 1.3 % (ref 0.0–5.0)
HCT: 38 % (ref 36.0–46.0)
Hemoglobin: 12.6 g/dL (ref 12.0–15.0)
LYMPHS PCT: 24.2 % (ref 12.0–46.0)
Lymphs Abs: 1.2 10*3/uL (ref 0.7–4.0)
MCHC: 33.1 g/dL (ref 30.0–36.0)
MCV: 97 fl (ref 78.0–100.0)
MONO ABS: 0.3 10*3/uL (ref 0.1–1.0)
Monocytes Relative: 5.6 % (ref 3.0–12.0)
NEUTROS ABS: 3.5 10*3/uL (ref 1.4–7.7)
NEUTROS PCT: 68.3 % (ref 43.0–77.0)
PLATELETS: 174 10*3/uL (ref 150.0–400.0)
RBC: 3.91 Mil/uL (ref 3.87–5.11)
RDW: 12.5 % (ref 11.5–15.5)
WBC: 5.2 10*3/uL (ref 4.0–10.5)

## 2017-05-27 LAB — LIPID PANEL
CHOLESTEROL: 155 mg/dL (ref 0–200)
HDL: 46.7 mg/dL (ref >39.00)
LDL Cholesterol: 72 mg/dL (ref 0–99)
NonHDL: 108.79
TRIGLYCERIDES: 182 mg/dL — AB (ref 0.0–149.0)
Total CHOL/HDL Ratio: 3
VLDL: 36.4 mg/dL (ref 0.0–40.0)

## 2017-05-27 LAB — TSH: TSH: 1.75 u[IU]/mL (ref 0.35–4.50)

## 2017-05-27 LAB — HEMOGLOBIN A1C: HEMOGLOBIN A1C: 5.1 % (ref 4.6–6.5)

## 2017-05-27 MED ORDER — PROPRANOLOL HCL 10 MG PO TABS: ORAL_TABLET | ORAL | 1 refills | Status: DC

## 2017-05-27 NOTE — Assessment & Plan Note (Signed)
Following with neurology/pain management Pain medication prescribed them Overall pain controlled

## 2017-05-27 NOTE — Assessment & Plan Note (Signed)
She takes propranolol only as needed and this works well. Refilled today

## 2017-05-27 NOTE — Assessment & Plan Note (Signed)
Due for GYN visit-will schedule

## 2017-05-27 NOTE — Assessment & Plan Note (Signed)
Check lipid panel, CMP, thyroid

## 2017-05-27 NOTE — Assessment & Plan Note (Signed)
A1c

## 2017-05-27 NOTE — Assessment & Plan Note (Signed)
GERD controlled Continue daily medication  

## 2017-05-28 ENCOUNTER — Encounter: Payer: Self-pay | Admitting: Internal Medicine

## 2017-05-28 DIAGNOSIS — Z5181 Encounter for therapeutic drug level monitoring: Secondary | ICD-10-CM | POA: Diagnosis not present

## 2017-05-28 DIAGNOSIS — M542 Cervicalgia: Secondary | ICD-10-CM | POA: Diagnosis not present

## 2017-05-28 DIAGNOSIS — M5481 Occipital neuralgia: Secondary | ICD-10-CM | POA: Diagnosis not present

## 2017-05-28 DIAGNOSIS — M5030 Other cervical disc degeneration, unspecified cervical region: Secondary | ICD-10-CM | POA: Diagnosis not present

## 2017-06-17 ENCOUNTER — Encounter: Payer: Self-pay | Admitting: Podiatry

## 2017-06-17 ENCOUNTER — Ambulatory Visit (INDEPENDENT_AMBULATORY_CARE_PROVIDER_SITE_OTHER): Payer: BLUE CROSS/BLUE SHIELD | Admitting: Podiatry

## 2017-06-17 DIAGNOSIS — M722 Plantar fascial fibromatosis: Secondary | ICD-10-CM

## 2017-06-17 DIAGNOSIS — M205X2 Other deformities of toe(s) (acquired), left foot: Secondary | ICD-10-CM | POA: Diagnosis not present

## 2017-06-17 MED ORDER — TRIAMCINOLONE ACETONIDE 10 MG/ML IJ SUSP
10.0000 mg | Freq: Once | INTRAMUSCULAR | Status: AC
  Administered 2017-06-17: 10 mg

## 2017-06-18 NOTE — Progress Notes (Signed)
Subjective:    Patient ID: Nicole Dunn, female   DOB: 51 y.o.   MRN: [id number]   HPI patient states the big toe left is moderately improved especially with certain shoes and I'm having a lot of pain in my right heel    ROS      Objective:  Physical Exam neurovascular status intact with inflammation of the right plantar heel with fluid buildup in the left first MPJ is mildly tender but improved     Assessment:   Acute plantar fasciitis right with mild discomfort on the left first MPJ      Plan:    H&P conditions reviewed and injected the right plantar fascia 3 mg Kenalog 5 mg Xylocaine and advised on physical therapy and padding therapy for the big toe joint left with possibility for future treatment depending on response

## 2017-06-29 DIAGNOSIS — M5030 Other cervical disc degeneration, unspecified cervical region: Secondary | ICD-10-CM | POA: Diagnosis not present

## 2017-06-29 DIAGNOSIS — M5481 Occipital neuralgia: Secondary | ICD-10-CM | POA: Diagnosis not present

## 2017-06-29 DIAGNOSIS — M542 Cervicalgia: Secondary | ICD-10-CM | POA: Diagnosis not present

## 2017-06-29 DIAGNOSIS — Z5181 Encounter for therapeutic drug level monitoring: Secondary | ICD-10-CM | POA: Diagnosis not present

## 2017-07-15 DIAGNOSIS — M5030 Other cervical disc degeneration, unspecified cervical region: Secondary | ICD-10-CM | POA: Diagnosis not present

## 2017-07-15 DIAGNOSIS — Z5181 Encounter for therapeutic drug level monitoring: Secondary | ICD-10-CM | POA: Diagnosis not present

## 2017-07-15 DIAGNOSIS — M5481 Occipital neuralgia: Secondary | ICD-10-CM | POA: Diagnosis not present

## 2017-07-15 DIAGNOSIS — M542 Cervicalgia: Secondary | ICD-10-CM | POA: Diagnosis not present

## 2017-08-10 DIAGNOSIS — F331 Major depressive disorder, recurrent, moderate: Secondary | ICD-10-CM | POA: Diagnosis not present

## 2017-08-10 DIAGNOSIS — F411 Generalized anxiety disorder: Secondary | ICD-10-CM | POA: Diagnosis not present

## 2017-08-14 DIAGNOSIS — F3289 Other specified depressive episodes: Secondary | ICD-10-CM | POA: Diagnosis not present

## 2017-08-24 DIAGNOSIS — M5481 Occipital neuralgia: Secondary | ICD-10-CM | POA: Diagnosis not present

## 2017-08-24 DIAGNOSIS — Z5181 Encounter for therapeutic drug level monitoring: Secondary | ICD-10-CM | POA: Diagnosis not present

## 2017-08-24 DIAGNOSIS — M5030 Other cervical disc degeneration, unspecified cervical region: Secondary | ICD-10-CM | POA: Diagnosis not present

## 2017-08-24 DIAGNOSIS — M542 Cervicalgia: Secondary | ICD-10-CM | POA: Diagnosis not present

## 2017-08-25 DIAGNOSIS — F3289 Other specified depressive episodes: Secondary | ICD-10-CM | POA: Diagnosis not present

## 2017-09-21 DIAGNOSIS — Z5181 Encounter for therapeutic drug level monitoring: Secondary | ICD-10-CM | POA: Diagnosis not present

## 2017-09-21 DIAGNOSIS — M542 Cervicalgia: Secondary | ICD-10-CM | POA: Diagnosis not present

## 2017-09-21 DIAGNOSIS — M5030 Other cervical disc degeneration, unspecified cervical region: Secondary | ICD-10-CM | POA: Diagnosis not present

## 2017-09-21 DIAGNOSIS — M5481 Occipital neuralgia: Secondary | ICD-10-CM | POA: Diagnosis not present

## 2017-09-22 DIAGNOSIS — F3289 Other specified depressive episodes: Secondary | ICD-10-CM | POA: Diagnosis not present

## 2017-10-01 ENCOUNTER — Encounter (HOSPITAL_COMMUNITY): Payer: Self-pay | Admitting: Emergency Medicine

## 2017-10-01 ENCOUNTER — Other Ambulatory Visit: Payer: Self-pay

## 2017-10-01 DIAGNOSIS — Z5321 Procedure and treatment not carried out due to patient leaving prior to being seen by health care provider: Secondary | ICD-10-CM | POA: Insufficient documentation

## 2017-10-01 DIAGNOSIS — M549 Dorsalgia, unspecified: Secondary | ICD-10-CM | POA: Insufficient documentation

## 2017-10-01 NOTE — ED Triage Notes (Signed)
Pt complaint of lower back pain radiating down legs with standing onset 1300 today; denies GU symptoms.Hx of bulging disc.

## 2017-10-02 ENCOUNTER — Emergency Department (HOSPITAL_COMMUNITY)
Admission: EM | Admit: 2017-10-02 | Discharge: 2017-10-02 | Disposition: A | Discharge status: Home / Self Care | Payer: BLUE CROSS/BLUE SHIELD | Attending: Emergency Medicine | Admitting: Emergency Medicine

## 2017-10-02 ENCOUNTER — Ambulatory Visit (INDEPENDENT_AMBULATORY_CARE_PROVIDER_SITE_OTHER): Payer: BLUE CROSS/BLUE SHIELD | Admitting: Internal Medicine

## 2017-10-02 ENCOUNTER — Encounter: Payer: Self-pay | Admitting: Internal Medicine

## 2017-10-02 VITALS — BP 116/76 | HR 74 | Temp 97.8°F | Resp 16 | Wt 143.0 lb

## 2017-10-02 DIAGNOSIS — M5416 Radiculopathy, lumbar region: Secondary | ICD-10-CM | POA: Diagnosis not present

## 2017-10-02 DIAGNOSIS — M5412 Radiculopathy, cervical region: Secondary | ICD-10-CM

## 2017-10-02 MED ORDER — DICLOFENAC SODIUM 75 MG PO TBEC: 75.0000 mg | DELAYED_RELEASE_TABLET | Freq: Two times a day (BID) | ORAL | 0 refills | Status: DC

## 2017-10-02 MED ORDER — PREDNISONE 20 MG PO TABS: 40.0000 mg | ORAL_TABLET | Freq: Every day | ORAL | 0 refills | Status: DC

## 2017-10-02 MED ORDER — CYCLOBENZAPRINE HCL 10 MG PO TABS: 10.0000 mg | ORAL_TABLET | Freq: Three times a day (TID) | ORAL | 0 refills | Status: DC | PRN

## 2017-10-02 NOTE — Progress Notes (Signed)
Subjective:    Patient ID: Nicole Dunn, female    DOB: 11/01/1965, 52 y.o.   MRN: [id number]  HPI The patient is here for an acute visit.  Back pain:  Her pain started yesterday.  She has a history of a bulging disc, L5-S1.  She has occasional flares of back pain and had one minor flare a couple of weeks ago that resolved quickly.  She was sitting with her left foot on her right knee.  She stood up and the pain was severe in her lower back.  She has pain across her lower back and radiates to her sacrum and both hips and down to mid upper b/l legs.  No N/T.  No change in bowel or bladder.  She has been taking gabapentin which she takes for cervical radiculpathy, vicodin and did traction at home yesterday.  She does have some muscle tightness in her lower back.    Medications and allergies reviewed with patient and updated if appropriate.  Patient Active Problem List   Diagnosis Date Noted  . Lumbar radiculopathy 10/02/2017  . High triglycerides 05/27/2017  . Hyperglycemia 05/24/2017  . PVC (premature ventricular contraction) 07/29/2011  . 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 Anxiety state 12/28/2008  . GERD 12/28/2008  . Migraine without aura 07/23/2007  . Cervical radiculopathy 07/23/2007  . Lumbar pain 07/23/2007    Current Outpatient Medications on File Prior to Visit  Medication Sig Dispense Refill  . aspirin 81 MG tablet Take 81 mg by mouth daily.    Marland Kitchen buPROPion (WELLBUTRIN XL) 300 MG 24 hr tablet Take 1 tablet (300 mg total) by mouth daily. Must transition care to new provider for refills 90 tablet 0  . busPIRone (BUSPAR) 5 MG tablet Take 5 mg by mouth 2 (two) times daily.     . Cholecalciferol (VITAMIN D3) 5000 units CAPS Take 1 capsule by mouth daily.    Marland Kitchen gabapentin (NEURONTIN) 300 MG capsule 600 mg in am 900 mg pm    . HYDROcodone-acetaminophen (NORCO) 7.5-325 MG tablet TAKE 1 TABLET BY MOUTH 4 TIMES A DAY IF TOLERATED  0  . lidocaine (XYLOCAINE) 4 %  external solution LIMIT 1-2 DROPS PER NOSTRIL AT THE FIRST SIGN OF HEADACHE AS DIRECTED  0  . Magnesium 400 MG TABS Take by mouth.    . Probiotic Product (SOLUBLE FIBER/PROBIOTICS PO) Take by mouth.    . propranolol (INDERAL) 10 MG tablet Take 2 tablets by mouth every 8-12 hours as needed. 90 tablet 1  . ranitidine (ZANTAC) 150 MG capsule Take 150 mg by mouth daily.     . traZODone (DESYREL) 50 MG tablet Take 50 mg by mouth at bedtime.     No current facility-administered medications on file prior to visit.     Past Medical History:  Diagnosis Date  . Anxiety   . Common migraine    with prodrome visual auro, "black spot with blind peripheral ring"  . Degenerative disc disease    with C6-7 encroachment & C3-4 narrowing  . Degenerative disc disease, lumbar    L5-S1, Dr. Niel Hummer, Neurology  . Endometrial hyperplasia 07/10/2011   Dr Lennie Muckle  . GERD (gastroesophageal reflux disease)   . Hypertriglyceridemia 2011   TG 176; HDL 58.9; LDL 121.7  . Nonspecific elevation of levels of transaminase or lactic acid dehydrogenase (LDH) 2011    ALT 61    Past Surgical History:  Procedure Laterality Date  . ENDOMETRIAL BIOPSY  05/20/2011  Dr Pamala Hurry   . ENDOSCOPIC PLANTAR FASCIOTOMY  2011   left foot, Dr. Paulla Dolly  . MENISCUS REPAIR  2012   Dr  Noemi Chapel  . otic tubes     bilaterally @age  4  . TONSILLECTOMY    . TOTAL ABDOMINAL HYSTERECTOMY  05/2011   Dr Nancy Marus, [facility]    Social History   Socioeconomic History  . Marital status: Married    Spouse name: None  . Number of children: None  . Years of education: None  . Highest education level: None  Social Needs  . Financial resource strain: None  . Food insecurity - worry: None  . Food insecurity - inability: None  . Transportation needs - medical: None  . Transportation needs - non-medical: None  Occupational History  . Occupation: Respiratory Therapist  Tobacco Use  . Smoking status: Former Smoker     Packs/day: 2.86    Years: 12.00    Pack years: 34.32    Types: Cigarettes    Last attempt to quit: 08/11/2006    Years since quitting: 11.1  . Smokeless tobacco: Never Used  . Tobacco comment: smoked 1983-2000, up to 1/2 ppd  Substance and Sexual Activity  . Alcohol use: Yes    Comment: rarely  . Drug use: No  . Sexual activity: Yes  Other Topics Concern  . None  Social History Narrative   exercises    Family History  Problem Relation Age of Onset  . Hypertension Father   . Deep vein thrombosis Father   . Pulmonary embolism Father   . Hypertension Mother   . Kidney failure Mother        transitory/iatrogenic  . Liver disease Mother        hepatic insufficiency  . OCD Mother   . Breast cancer Mother   . Peripheral vascular disease Brother   . Clotting disorder Paternal Grandfather   . Pulmonary embolism Paternal Grandfather   . Bipolar disorder Unknown        sister & brother  . Depression Unknown        sister & brother  . Heart attack Maternal Grandmother        in 71s  . Depression Other   . Asthma Neg Hx   . Diabetes Neg Hx   . Stroke Neg Hx     Review of Systems  Constitutional: Negative for chills and fever.  Gastrointestinal:       No change in bowels/incontinence  Genitourinary:       No change in urination/incontinence  Musculoskeletal: Positive for back pain and myalgias.  Neurological: Negative for weakness and numbness.       Objective:   Vitals:   10/02/17 1307  BP: 116/76  Pulse: 74  Resp: 16  Temp: 97.8 F (36.6 C)  SpO2: 98%   Wt Readings from Last 3 Encounters:  10/02/17 143 lb (64.9 kg)  05/27/17 145 lb (65.8 kg)  09/09/16 148 lb (67.1 kg)   Body mass index is 24.55 kg/m.   Physical Exam  Constitutional: She appears well-developed and well-nourished. No distress.  HENT:  Head: Normocephalic and atraumatic.  Musculoskeletal: She exhibits no edema.  Pain in central lower back  Neurological: Coordination normal.  Gait with  slight limp, normal sensation and strength in b/l LE  Skin: Skin is warm and dry. She is not diaphoretic.           Assessment & Plan:    See Problem List for Assessment and  Plan of chronic medical problems.

## 2017-10-02 NOTE — ED Notes (Signed)
Bed: WA05 Expected date:  Expected time:  Means of arrival:  Comments: 

## 2017-10-02 NOTE — Assessment & Plan Note (Addendum)
Following with pain management  Taking gabapentin, vicodin Does traction at home Symptoms worsening - would like referral to orthopedics -  Referred today

## 2017-10-02 NOTE — ED Notes (Signed)
Pt called from triage with no answer 

## 2017-10-02 NOTE — Patient Instructions (Addendum)
   Medications reviewed and updated.  Changes include taking prednisone for 5 days - take with food, take the flexeril as needed.  Take the diclofenac as needed after you finish the steroids.   Your prescription(s) have been submitted to your pharmacy. Please take as directed and contact our office if you believe you are having problem(s) with the medication(s).  A referral was ordered for orthopedics.

## 2017-10-02 NOTE — Assessment & Plan Note (Signed)
Acute pain, has had several episodes in past Positive b/l radiculopathy down to mid posterior upper thighs Prednisone 40 mg daily x 5 days, then diclofenac as needed Flexeril as needed Already on gabapentin and vicodin - prescribed by pain management Referred to ortho  Deferred PT - can not afford

## 2017-10-03 ENCOUNTER — Other Ambulatory Visit: Payer: Self-pay | Admitting: Internal Medicine

## 2017-10-19 DIAGNOSIS — M542 Cervicalgia: Secondary | ICD-10-CM | POA: Diagnosis not present

## 2017-10-19 DIAGNOSIS — M5481 Occipital neuralgia: Secondary | ICD-10-CM | POA: Diagnosis not present

## 2017-10-19 DIAGNOSIS — Z5181 Encounter for therapeutic drug level monitoring: Secondary | ICD-10-CM | POA: Diagnosis not present

## 2017-10-19 DIAGNOSIS — M5030 Other cervical disc degeneration, unspecified cervical region: Secondary | ICD-10-CM | POA: Diagnosis not present

## 2017-10-19 DIAGNOSIS — M545 Low back pain: Secondary | ICD-10-CM | POA: Diagnosis not present

## 2017-10-20 DIAGNOSIS — F3289 Other specified depressive episodes: Secondary | ICD-10-CM | POA: Diagnosis not present

## 2017-11-16 DIAGNOSIS — M542 Cervicalgia: Secondary | ICD-10-CM | POA: Diagnosis not present

## 2017-11-16 DIAGNOSIS — M5030 Other cervical disc degeneration, unspecified cervical region: Secondary | ICD-10-CM | POA: Diagnosis not present

## 2017-11-16 DIAGNOSIS — Z5181 Encounter for therapeutic drug level monitoring: Secondary | ICD-10-CM | POA: Diagnosis not present

## 2017-11-16 DIAGNOSIS — M5481 Occipital neuralgia: Secondary | ICD-10-CM | POA: Diagnosis not present

## 2017-11-17 DIAGNOSIS — F3289 Other specified depressive episodes: Secondary | ICD-10-CM | POA: Diagnosis not present

## 2017-12-07 DIAGNOSIS — M545 Low back pain: Secondary | ICD-10-CM | POA: Diagnosis not present

## 2017-12-07 DIAGNOSIS — L03012 Cellulitis of left finger: Secondary | ICD-10-CM | POA: Diagnosis not present

## 2017-12-07 DIAGNOSIS — F329 Major depressive disorder, single episode, unspecified: Secondary | ICD-10-CM | POA: Diagnosis not present

## 2017-12-14 DIAGNOSIS — M542 Cervicalgia: Secondary | ICD-10-CM | POA: Diagnosis not present

## 2017-12-14 DIAGNOSIS — Z5181 Encounter for therapeutic drug level monitoring: Secondary | ICD-10-CM | POA: Diagnosis not present

## 2017-12-14 DIAGNOSIS — M5030 Other cervical disc degeneration, unspecified cervical region: Secondary | ICD-10-CM | POA: Diagnosis not present

## 2017-12-14 DIAGNOSIS — M5481 Occipital neuralgia: Secondary | ICD-10-CM | POA: Diagnosis not present

## 2018-01-11 DIAGNOSIS — M5481 Occipital neuralgia: Secondary | ICD-10-CM | POA: Diagnosis not present

## 2018-01-11 DIAGNOSIS — M5030 Other cervical disc degeneration, unspecified cervical region: Secondary | ICD-10-CM | POA: Diagnosis not present

## 2018-01-11 DIAGNOSIS — Z5181 Encounter for therapeutic drug level monitoring: Secondary | ICD-10-CM | POA: Diagnosis not present

## 2018-01-11 DIAGNOSIS — M542 Cervicalgia: Secondary | ICD-10-CM | POA: Diagnosis not present

## 2018-01-20 ENCOUNTER — Telehealth: Payer: Self-pay | Admitting: *Deleted

## 2018-01-20 DIAGNOSIS — Z1231 Encounter for screening mammogram for malignant neoplasm of breast: Secondary | ICD-10-CM | POA: Diagnosis not present

## 2018-01-20 DIAGNOSIS — Z6823 Body mass index (BMI) 23.0-23.9, adult: Secondary | ICD-10-CM | POA: Diagnosis not present

## 2018-01-20 DIAGNOSIS — Z01419 Encounter for gynecological examination (general) (routine) without abnormal findings: Secondary | ICD-10-CM | POA: Diagnosis not present

## 2018-01-20 MED ORDER — DICLOFENAC SODIUM 75 MG PO TBEC: 75.0000 mg | DELAYED_RELEASE_TABLET | Freq: Two times a day (BID) | ORAL | 0 refills | Status: DC

## 2018-01-20 NOTE — Telephone Encounter (Signed)
Refill request for diclofenac. Dr. Paulla Dolly states refill as 05/21/2017 and pt needs an appt prior to future refills.

## 2018-01-26 DIAGNOSIS — F331 Major depressive disorder, recurrent, moderate: Secondary | ICD-10-CM | POA: Diagnosis not present

## 2018-01-26 DIAGNOSIS — F411 Generalized anxiety disorder: Secondary | ICD-10-CM | POA: Diagnosis not present

## 2018-02-08 DIAGNOSIS — Z5181 Encounter for therapeutic drug level monitoring: Secondary | ICD-10-CM | POA: Diagnosis not present

## 2018-02-08 DIAGNOSIS — M5030 Other cervical disc degeneration, unspecified cervical region: Secondary | ICD-10-CM | POA: Diagnosis not present

## 2018-02-08 DIAGNOSIS — M542 Cervicalgia: Secondary | ICD-10-CM | POA: Diagnosis not present

## 2018-02-08 DIAGNOSIS — M5481 Occipital neuralgia: Secondary | ICD-10-CM | POA: Diagnosis not present

## 2018-02-13 ENCOUNTER — Other Ambulatory Visit: Payer: Self-pay | Admitting: Internal Medicine

## 2018-02-17 ENCOUNTER — Telehealth: Payer: Self-pay | Admitting: *Deleted

## 2018-02-17 NOTE — Telephone Encounter (Signed)
Refill request for diclofenac. Dr. Paulla Dolly states pt needs to be seen prior to future refills. Return fax denying.

## 2018-03-02 ENCOUNTER — Encounter: Payer: Self-pay | Admitting: Internal Medicine

## 2018-03-08 DIAGNOSIS — M542 Cervicalgia: Secondary | ICD-10-CM | POA: Diagnosis not present

## 2018-03-08 DIAGNOSIS — M5030 Other cervical disc degeneration, unspecified cervical region: Secondary | ICD-10-CM | POA: Diagnosis not present

## 2018-03-08 DIAGNOSIS — M5481 Occipital neuralgia: Secondary | ICD-10-CM | POA: Diagnosis not present

## 2018-03-08 DIAGNOSIS — Z5181 Encounter for therapeutic drug level monitoring: Secondary | ICD-10-CM | POA: Diagnosis not present

## 2018-03-18 ENCOUNTER — Encounter: Payer: Self-pay | Admitting: Internal Medicine

## 2018-03-18 ENCOUNTER — Other Ambulatory Visit (INDEPENDENT_AMBULATORY_CARE_PROVIDER_SITE_OTHER): Payer: BLUE CROSS/BLUE SHIELD

## 2018-03-18 ENCOUNTER — Ambulatory Visit (INDEPENDENT_AMBULATORY_CARE_PROVIDER_SITE_OTHER)
Admission: RE | Admit: 2018-03-18 | Discharge: 2018-03-18 | Disposition: A | Discharge status: Home / Self Care | Payer: BLUE CROSS/BLUE SHIELD | Source: Ambulatory Visit | Attending: Internal Medicine | Admitting: Internal Medicine

## 2018-03-18 ENCOUNTER — Ambulatory Visit (INDEPENDENT_AMBULATORY_CARE_PROVIDER_SITE_OTHER): Payer: BLUE CROSS/BLUE SHIELD | Admitting: Internal Medicine

## 2018-03-18 VITALS — BP 124/74 | HR 67 | Temp 97.4°F | Resp 16 | Wt 136.0 lb

## 2018-03-18 DIAGNOSIS — R634 Abnormal weight loss: Secondary | ICD-10-CM

## 2018-03-18 DIAGNOSIS — S6992XA Unspecified injury of left wrist, hand and finger(s), initial encounter: Secondary | ICD-10-CM

## 2018-03-18 DIAGNOSIS — S60132A Contusion of left middle finger with damage to nail, initial encounter: Secondary | ICD-10-CM | POA: Diagnosis not present

## 2018-03-18 LAB — CBC WITH DIFFERENTIAL/PLATELET
BASOS PCT: 0.7 % (ref 0.0–3.0)
Basophils Absolute: 0 10*3/uL (ref 0.0–0.1)
EOS ABS: 0.1 10*3/uL (ref 0.0–0.7)
Eosinophils Relative: 2 % (ref 0.0–5.0)
HCT: 36.9 % (ref 36.0–46.0)
HEMOGLOBIN: 12.5 g/dL (ref 12.0–15.0)
Lymphocytes Relative: 38.3 % (ref 12.0–46.0)
Lymphs Abs: 1.5 10*3/uL (ref 0.7–4.0)
MCHC: 33.9 g/dL (ref 30.0–36.0)
MCV: 94.8 fl (ref 78.0–100.0)
MONO ABS: 0.3 10*3/uL (ref 0.1–1.0)
Monocytes Relative: 7.1 % (ref 3.0–12.0)
NEUTROS ABS: 2 10*3/uL (ref 1.4–7.7)
Neutrophils Relative %: 51.9 % (ref 43.0–77.0)
PLATELETS: 180 10*3/uL (ref 150.0–400.0)
RBC: 3.9 Mil/uL (ref 3.87–5.11)
RDW: 12.4 % (ref 11.5–15.5)
WBC: 3.9 10*3/uL — ABNORMAL LOW (ref 4.0–10.5)

## 2018-03-18 LAB — COMPREHENSIVE METABOLIC PANEL
ALT: 45 U/L — ABNORMAL HIGH (ref 0–35)
AST: 26 U/L (ref 0–37)
Albumin: 4.5 g/dL (ref 3.5–5.2)
Alkaline Phosphatase: 49 U/L (ref 39–117)
BUN: 17 mg/dL (ref 6–23)
CHLORIDE: 106 meq/L (ref 96–112)
CO2: 28 meq/L (ref 19–32)
CREATININE: 1.03 mg/dL (ref 0.40–1.20)
Calcium: 9.7 mg/dL (ref 8.4–10.5)
GFR: 59.86 mL/min — ABNORMAL LOW (ref >60.00)
Glucose, Bld: 94 mg/dL (ref 70–99)
Potassium: 4.4 mEq/L (ref 3.5–5.1)
SODIUM: 143 meq/L (ref 135–145)
Total Bilirubin: 0.4 mg/dL (ref 0.2–1.2)
Total Protein: 7.1 g/dL (ref 6.0–8.3)

## 2018-03-18 LAB — TSH: TSH: 3.44 u[IU]/mL (ref 0.35–4.50)

## 2018-03-18 NOTE — Progress Notes (Signed)
Subjective:    Patient ID: Nicole Dunn, female    DOB: Aug 01, 1966, 52 y.o.   MRN: [id number]  HPI The patient is here for an acute visit.  Yesterday afternoon she was putting together a bed frame in the bed frame fell on her left middle finger and her finger was caught between the headboard and frame.  It hit near the DIP joint.  Since then the tip of her finger has been purple, cold and numb.  She has some pain at the tip of the finger and at the DIP joint.  Since yesterday there has been no significant change.  She was worried about the possibility of compression syndrome.  It does hurt to move the DIP joint.  She has had a 15 pound weight loss over the past few months.  She has not changed her diet, eating habits or activity level.  She was concerned that this was occurring for no reason.  She feels her weight has leveled off.  She is happy with her weight, but just wanted to make sure it was not occurring for the wrong reason.  She does have depression and states it could be the depression, but does not feel that her eating habits have changed.  She has noticed that her hair is thinning as well.  She was not sure of her thyroid was off.   Medications and allergies reviewed with patient and updated if appropriate.  Patient Active Problem List   Diagnosis Date Noted  . Lumbar radiculopathy 10/02/2017  . High triglycerides 05/27/2017  . Hyperglycemia 05/24/2017  . PVC (premature ventricular contraction) 07/29/2011  . 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 Anxiety state 12/28/2008  . GERD 12/28/2008  . Migraine without aura 07/23/2007  . Cervical radiculopathy 07/23/2007  . Lumbar pain 07/23/2007    Current Outpatient Medications on File Prior to Visit  Medication Sig Dispense Refill  . aspirin 81 MG tablet Take 81 mg by mouth daily.    Marland Kitchen buPROPion (WELLBUTRIN XL) 300 MG 24 hr tablet Take 1 tablet (300 mg total) by mouth daily. Must transition care to new provider for  refills 90 tablet 0  . busPIRone (BUSPAR) 5 MG tablet Take 5 mg by mouth 2 (two) times daily.     . Cholecalciferol (VITAMIN D3) 5000 units CAPS Take 1 capsule by mouth daily.    . cyclobenzaprine (FLEXERIL) 10 MG tablet Take 1 tablet (10 mg total) by mouth 3 (three) times daily as needed for muscle spasms. 30 tablet 0  . diclofenac (VOLTAREN) 75 MG EC tablet Take 1 tablet (75 mg total) by mouth 2 (two) times daily. 60 tablet 0  . diclofenac (VOLTAREN) 75 MG EC tablet Take 1 tablet (75 mg total) by mouth 2 (two) times daily. 50 tablet 0  . gabapentin (NEURONTIN) 300 MG capsule 600 mg in am 900 mg pm    . HYDROcodone-acetaminophen (NORCO) 7.5-325 MG tablet TAKE 1 TABLET BY MOUTH 4 TIMES A DAY IF TOLERATED  0  . lidocaine (XYLOCAINE) 4 % external solution LIMIT 1-2 DROPS PER NOSTRIL AT THE FIRST SIGN OF HEADACHE AS DIRECTED  0  . Magnesium 400 MG TABS Take by mouth.    . predniSONE (DELTASONE) 20 MG tablet Take 2 tablets (40 mg total) by mouth daily with breakfast. 10 tablet 0  . Probiotic Product (SOLUBLE FIBER/PROBIOTICS PO) Take by mouth.    . propranolol (INDERAL) 10 MG tablet TAKE 2 TABLETS EVERY 8 TO 12  HOURS AS NEEDED -- Office visit needed for further refills 90 tablet 0  . ranitidine (ZANTAC) 150 MG capsule Take 150 mg by mouth daily.     . traZODone (DESYREL) 50 MG tablet Take 50 mg by mouth at bedtime.     No current facility-administered medications on file prior to visit.     Past Medical History:  Diagnosis Date  . Anxiety   . Common migraine    with prodrome visual auro, "black spot with blind peripheral ring"  . Degenerative disc disease    with C6-7 encroachment & C3-4 narrowing  . Degenerative disc disease, lumbar    L5-S1, Dr. Niel Hummer, Neurology  . Endometrial hyperplasia 07/10/2011   Dr Lennie Muckle  . GERD (gastroesophageal reflux disease)   . Hypertriglyceridemia 2011   TG 176; HDL 58.9; LDL 121.7  . Nonspecific elevation of levels of transaminase or  lactic acid dehydrogenase (LDH) 2011    ALT 61    Past Surgical History:  Procedure Laterality Date  . ENDOMETRIAL BIOPSY  05/20/2011   Dr Pamala Hurry   . ENDOSCOPIC PLANTAR FASCIOTOMY  2011   left foot, Dr. Paulla Dolly  . MENISCUS REPAIR  2012   Dr  Noemi Chapel  . otic tubes     bilaterally @age  4  . TONSILLECTOMY    . TOTAL ABDOMINAL HYSTERECTOMY  05/2011   Dr Nancy Marus, [facility]    Social History   Socioeconomic History  . Marital status: Married    Spouse name: Not on file  . Number of children: Not on file  . Years of education: Not on file  . Highest education level: Not on file  Occupational History  . Occupation: Respiratory Therapist  Social Needs  . Financial resource strain: Not on file  . Food insecurity:    Worry: Not on file    Inability: Not on file  . Transportation needs:    Medical: Not on file    Non-medical: Not on file  Tobacco Use  . Smoking status: Former Smoker    Packs/day: 2.86    Years: 12.00    Pack years: 34.32    Types: Cigarettes    Last attempt to quit: 08/11/2006    Years since quitting: 11.6  . Smokeless tobacco: Never Used  . Tobacco comment: smoked 1983-2000, up to 1/2 ppd  Substance and Sexual Activity  . Alcohol use: Yes    Comment: rarely  . Drug use: No  . Sexual activity: Yes  Lifestyle  . Physical activity:    Days per week: Not on file    Minutes per session: Not on file  . Stress: Not on file  Relationships  . Social connections:    Talks on phone: Not on file    Gets together: Not on file    Attends religious service: Not on file    Active member of club or organization: Not on file    Attends meetings of clubs or organizations: Not on file    Relationship status: Not on file  Other Topics Concern  . Not on file  Social History Narrative   exercises    Family History  Problem Relation Age of Onset  . Hypertension Father   . Deep vein thrombosis Father   . Pulmonary embolism Father   . Hypertension Mother   .  Kidney failure Mother        transitory/iatrogenic  . Liver disease Mother        hepatic insufficiency  . OCD Mother   .  Breast cancer Mother   . Peripheral vascular disease Brother   . Clotting disorder Paternal Grandfather   . Pulmonary embolism Paternal Grandfather   . Bipolar disorder Unknown        sister & brother  . Depression Unknown        sister & brother  . Heart attack Maternal Grandmother        in 65s  . Depression Other   . Asthma Neg Hx   . Diabetes Neg Hx   . Stroke Neg Hx     Review of Systems  Constitutional: Positive for unexpected weight change. Negative for chills and fever.  Respiratory: Negative for cough, shortness of breath and wheezing.   Musculoskeletal: Positive for arthralgias and joint swelling.  Skin: Positive for color change (Left middle finger). Negative for wound.       Thinning hair       Objective:   Vitals:   03/18/18 0938  BP: 124/74  Pulse: 67  Resp: 16  Temp: (!) 97.4 F (36.3 C)  SpO2: 99%   BP Readings from Last 3 Encounters:  03/18/18 124/74  10/02/17 116/76  10/01/17 134/77   Wt Readings from Last 3 Encounters:  03/18/18 136 lb (61.7 kg)  10/02/17 143 lb (64.9 kg)  05/27/17 145 lb (65.8 kg)   Body mass index is 23.34 kg/m.   Physical Exam  Constitutional: She appears well-developed and well-nourished. No distress.  HENT:  Head: Normocephalic and atraumatic.  Musculoskeletal:  Left middle finger with purplish discoloration and swelling just proximal to DIP joint to tip.  Mild pain at the tip of finger and at the DIP joint.  Flexion of DIP joint causes pain.  No skin abrasion or open wound.  decreased sensation tip of finger  Skin: She is not diaphoretic.  No focal hair loss           Assessment & Plan:    See Problem List for Assessment and Plan of chronic medical problems.

## 2018-03-18 NOTE — Patient Instructions (Addendum)
Have blood work and an Insurance account manager today.    Test(s) ordered today. Your results will be released to [place] (or called to you) after review, usually within 72hours after test completion. If any changes need to be made, you will be notified at that same time.  Medications reviewed and updated.  No changes recommended at this time.    A referral was ordered for hand surgery.

## 2018-03-18 NOTE — Assessment & Plan Note (Signed)
Occurred yesterday-finger got caught between the headboard and the bed frame X-ray to rule out fracture She does have significant swelling, purple sensation and numbness Unlikely compartment syndrome, but since that has not been much improvement since yesterday we will need to be further evaluated Refer to hand orthopedics-go to the emergency room if she is not able to be seen in the next couple of days and her symptoms worsen, but hopefully her symptoms will improve over the next 24 hours

## 2018-03-18 NOTE — Assessment & Plan Note (Signed)
She reports a 15 pound weight loss without change in diet, eating habits or activity level.  Weight has leveled off for her scale Possibly related to depression Will rule out thyroid disorder another cause-CBC, CMP and TSH today She will monitor her weight closely and let me know if her weight continues to go down, but since it has leveled off we will just monitor for now

## 2018-03-19 ENCOUNTER — Other Ambulatory Visit: Payer: Self-pay | Admitting: Internal Medicine

## 2018-03-24 ENCOUNTER — Ambulatory Visit: Payer: BLUE CROSS/BLUE SHIELD | Admitting: Internal Medicine

## 2018-03-26 ENCOUNTER — Ambulatory Visit (INDEPENDENT_AMBULATORY_CARE_PROVIDER_SITE_OTHER): Payer: BLUE CROSS/BLUE SHIELD

## 2018-03-26 ENCOUNTER — Encounter: Payer: Self-pay | Admitting: Podiatry

## 2018-03-26 ENCOUNTER — Ambulatory Visit (INDEPENDENT_AMBULATORY_CARE_PROVIDER_SITE_OTHER): Payer: BLUE CROSS/BLUE SHIELD | Admitting: Podiatry

## 2018-03-26 DIAGNOSIS — M722 Plantar fascial fibromatosis: Secondary | ICD-10-CM

## 2018-03-26 MED ORDER — PREDNISONE 10 MG PO TABS: ORAL_TABLET | ORAL | 0 refills | Status: DC

## 2018-03-26 MED ORDER — TRIAMCINOLONE ACETONIDE 10 MG/ML IJ SUSP
10.0000 mg | Freq: Once | INTRAMUSCULAR | Status: AC
  Administered 2018-03-26: 10 mg

## 2018-03-27 NOTE — Progress Notes (Signed)
Subjective:   Patient ID: Nicole Dunn, female   DOB: 52 y.o.   MRN: [id number]   HPI Patient presents with pain in both heels stating that they get very tender and make it hard for her to walk.  States that it is been ongoing and that eventually she knows she is probably getting need something else time.  She is had surgery on the left and knows eventually she will need procedure on the right   ROS      Objective:  Physical Exam  Neurovascular status found to be intact muscle strength is adequate range of motion within normal limits with intense discomfort plantar aspect right heel and moderate discomfort in the left dorsal tendon complex.  The heel on the right is the big problem that she is experiencing     Assessment:  Acute plantar fasciitis right with tendinitis left     Plan:  Discussed long-term nature of condition we will try to will be conservatively and today injection therapy was administered 3 mg Kenalog 5 Milgram Xylocaine along with advice for anti-inflammatories.  Reappoint for [place] to recheck again in 3 weeks and will decide if anything else is appropriate

## 2018-04-05 DIAGNOSIS — Z5181 Encounter for therapeutic drug level monitoring: Secondary | ICD-10-CM | POA: Diagnosis not present

## 2018-04-05 DIAGNOSIS — M5030 Other cervical disc degeneration, unspecified cervical region: Secondary | ICD-10-CM | POA: Diagnosis not present

## 2018-04-05 DIAGNOSIS — M542 Cervicalgia: Secondary | ICD-10-CM | POA: Diagnosis not present

## 2018-04-05 DIAGNOSIS — M5481 Occipital neuralgia: Secondary | ICD-10-CM | POA: Diagnosis not present

## 2018-04-12 ENCOUNTER — Other Ambulatory Visit: Payer: Self-pay | Admitting: Internal Medicine

## 2018-04-15 ENCOUNTER — Ambulatory Visit (INDEPENDENT_AMBULATORY_CARE_PROVIDER_SITE_OTHER): Payer: BLUE CROSS/BLUE SHIELD | Admitting: Podiatry

## 2018-04-15 ENCOUNTER — Encounter: Payer: Self-pay | Admitting: Podiatry

## 2018-04-15 DIAGNOSIS — M722 Plantar fascial fibromatosis: Secondary | ICD-10-CM

## 2018-04-18 NOTE — Progress Notes (Signed)
Subjective:   Patient ID: Nicole Dunn, female   DOB: 52 y.o.   MRN: [id number]   HPI Patient presents stating I am feeling a lot better with significant diminishment of discomfort   ROS      Objective:  Physical Exam  Neurovascular status intact with patient found to have significant diminishment of pain in the plantar aspect of the heel     Assessment:  Currently doing very well with plantar fascial treatment     Plan:  Reviewed condition and recommended the continuation of conservative care supportive shoes and still may require shockwave depending on symptoms.  Reappoint to recheck

## 2018-04-19 ENCOUNTER — Ambulatory Visit (INDEPENDENT_AMBULATORY_CARE_PROVIDER_SITE_OTHER): Payer: BLUE CROSS/BLUE SHIELD | Admitting: Internal Medicine

## 2018-04-19 ENCOUNTER — Other Ambulatory Visit (INDEPENDENT_AMBULATORY_CARE_PROVIDER_SITE_OTHER): Payer: BLUE CROSS/BLUE SHIELD

## 2018-04-19 ENCOUNTER — Encounter: Payer: Self-pay | Admitting: Internal Medicine

## 2018-04-19 VITALS — BP 138/88 | HR 76 | Temp 98.2°F | Resp 16 | Ht 64.0 in | Wt 136.4 lb

## 2018-04-19 DIAGNOSIS — R590 Localized enlarged lymph nodes: Secondary | ICD-10-CM | POA: Diagnosis not present

## 2018-04-19 DIAGNOSIS — M26623 Arthralgia of bilateral temporomandibular joint: Secondary | ICD-10-CM | POA: Insufficient documentation

## 2018-04-19 LAB — CBC WITH DIFFERENTIAL/PLATELET
Basophils Absolute: 0 10*3/uL (ref 0.0–0.1)
Basophils Relative: 0.6 % (ref 0.0–3.0)
EOS ABS: 0 10*3/uL (ref 0.0–0.7)
Eosinophils Relative: 0.8 % (ref 0.0–5.0)
HCT: 37 % (ref 36.0–46.0)
Hemoglobin: 12.5 g/dL (ref 12.0–15.0)
LYMPHS ABS: 1.4 10*3/uL (ref 0.7–4.0)
Lymphocytes Relative: 27.7 % (ref 12.0–46.0)
MCHC: 33.8 g/dL (ref 30.0–36.0)
MCV: 95.6 fl (ref 78.0–100.0)
MONO ABS: 0.3 10*3/uL (ref 0.1–1.0)
Monocytes Relative: 5.4 % (ref 3.0–12.0)
NEUTROS ABS: 3.4 10*3/uL (ref 1.4–7.7)
NEUTROS PCT: 65.5 % (ref 43.0–77.0)
PLATELETS: 163 10*3/uL (ref 150.0–400.0)
RBC: 3.87 Mil/uL (ref 3.87–5.11)
RDW: 12.5 % (ref 11.5–15.5)
WBC: 5.2 10*3/uL (ref 4.0–10.5)

## 2018-04-19 LAB — TSH: TSH: 1.15 u[IU]/mL (ref 0.35–4.50)

## 2018-04-19 LAB — COMPREHENSIVE METABOLIC PANEL
ALT: 46 U/L — AB (ref 0–35)
AST: 19 U/L (ref 0–37)
Albumin: 4.3 g/dL (ref 3.5–5.2)
Alkaline Phosphatase: 48 U/L (ref 39–117)
BUN: 16 mg/dL (ref 6–23)
CO2: 27 meq/L (ref 19–32)
CREATININE: 0.92 mg/dL (ref 0.40–1.20)
Calcium: 9.3 mg/dL (ref 8.4–10.5)
Chloride: 109 mEq/L (ref 96–112)
GFR: 68.17 mL/min (ref >60.00)
GLUCOSE: 79 mg/dL (ref 70–99)
Potassium: 3.8 mEq/L (ref 3.5–5.1)
Sodium: 143 mEq/L (ref 135–145)
TOTAL PROTEIN: 6.8 g/dL (ref 6.0–8.3)
Total Bilirubin: 0.5 mg/dL (ref 0.2–1.2)

## 2018-04-19 NOTE — Progress Notes (Signed)
Subjective:    Patient ID: Nicole Dunn, female    DOB: June 09, 1966, 52 y.o.   MRN: [id number]  HPI The patient is here for an acute visit.  Last week she thought she was grinding her teeth because she was having jaw and neck pain that was worse with chewing.  The pain was located near the ears initially, but then started radiating down the neck and has gotten worse.  She also feels like her eyes are puffy and her neck glands are swollen.    She was concerned about infection or cancer.    advil and Excedrin have not helped.   She is not grinding her teeth and has no history of TMJ.  She has no dental issues and has no teeth pain.  She last saw her dentist 4 months ago.     She has a recurrring sore in nose, which is not new.  She denies other symptoms of infection.   Medications and allergies reviewed with patient and updated if appropriate.  Patient Active Problem List   Diagnosis Date Noted  . Weight loss, unintentional 03/18/2018  . Finger injury, left, initial encounter 03/18/2018  . Lumbar radiculopathy 10/02/2017  . High triglycerides 05/27/2017  . Hyperglycemia 05/24/2017  . PVC (premature ventricular contraction) 07/29/2011  . 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 Anxiety state 12/28/2008  . GERD 12/28/2008  . Migraine without aura 07/23/2007  . Cervical radiculopathy 07/23/2007  . Lumbar pain 07/23/2007    Current Outpatient Medications on File Prior to Visit  Medication Sig Dispense Refill  . buPROPion (WELLBUTRIN XL) 300 MG 24 hr tablet Take 1 tablet (300 mg total) by mouth daily. Must transition care to new provider for refills 90 tablet 0  . busPIRone (BUSPAR) 5 MG tablet Take 5 mg by mouth 2 (two) times daily.     . Cholecalciferol (VITAMIN D3) 5000 units CAPS Take 1 capsule by mouth daily.    Marland Kitchen gabapentin (NEURONTIN) 300 MG capsule 600 mg in am 900 mg pm    . HYDROcodone-acetaminophen (NORCO) 7.5-325 MG tablet TAKE 1 TABLET BY MOUTH 4 TIMES A DAY  IF TOLERATED  0  . lidocaine (XYLOCAINE) 4 % external solution LIMIT 1-2 DROPS PER NOSTRIL AT THE FIRST SIGN OF HEADACHE AS DIRECTED  0  . Magnesium 400 MG CAPS Take 400 mg by mouth.    . Probiotic Product (SOLUBLE FIBER/PROBIOTICS PO) Take by mouth.    . propranolol (INDERAL) 10 MG tablet TAKE 2 TABLETS EVERY 8 TO 12 HOURS AS NEEDED -- OFFICE VISIT NEEDED FOR FURTHER REFILLS 90 tablet 0  . ranitidine (ZANTAC) 150 MG capsule Take 150 mg by mouth daily.     . tizanidine (ZANAFLEX) 2 MG capsule Take 2 mg by mouth 3 (three) times daily.    . traZODone (DESYREL) 50 MG tablet Take 50 mg by mouth at bedtime.     No current facility-administered medications on file prior to visit.     Past Medical History:  Diagnosis Date  . Anxiety   . Common migraine    with prodrome visual auro, "black spot with blind peripheral ring"  . Degenerative disc disease    with C6-7 encroachment & C3-4 narrowing  . Degenerative disc disease, lumbar    L5-S1, Dr. Niel Hummer, Neurology  . Endometrial hyperplasia 07/10/2011   Dr Lennie Muckle  . GERD (gastroesophageal reflux disease)   . Hypertriglyceridemia 2011   TG 176; HDL 58.9; LDL 121.7  . Nonspecific  elevation of levels of transaminase or lactic acid dehydrogenase (LDH) 2011    ALT 61    Past Surgical History:  Procedure Laterality Date  . ENDOMETRIAL BIOPSY  05/20/2011   Dr Pamala Hurry   . ENDOSCOPIC PLANTAR FASCIOTOMY  2011   left foot, Dr. Paulla Dolly  . MENISCUS REPAIR  2012   Dr  Noemi Chapel  . otic tubes     bilaterally @age  4  . TONSILLECTOMY    . TOTAL ABDOMINAL HYSTERECTOMY  05/2011   Dr Nancy Marus, [facility]    Social History   Socioeconomic History  . Marital status: Married    Spouse name: Not on file  . Number of children: Not on file  . Years of education: Not on file  . Highest education level: Not on file  Occupational History  . Occupation: Respiratory Therapist  Social Needs  . Financial resource strain: Not on file  . Food  insecurity:    Worry: Not on file    Inability: Not on file  . Transportation needs:    Medical: Not on file    Non-medical: Not on file  Tobacco Use  . Smoking status: Former Smoker    Packs/day: 2.86    Years: 12.00    Pack years: 34.32    Types: Cigarettes    Last attempt to quit: 08/11/2006    Years since quitting: 11.6  . Smokeless tobacco: Never Used  . Tobacco comment: smoked 1983-2000, up to 1/2 ppd  Substance and Sexual Activity  . Alcohol use: Yes    Comment: rarely  . Drug use: No  . Sexual activity: Yes  Lifestyle  . Physical activity:    Days per week: Not on file    Minutes per session: Not on file  . Stress: Not on file  Relationships  . Social connections:    Talks on phone: Not on file    Gets together: Not on file    Attends religious service: Not on file    Active member of club or organization: Not on file    Attends meetings of clubs or organizations: Not on file    Relationship status: Not on file  Other Topics Concern  . Not on file  Social History Narrative   exercises    Family History  Problem Relation Age of Onset  . Hypertension Father   . Deep vein thrombosis Father   . Pulmonary embolism Father   . Hypertension Mother   . Kidney failure Mother        transitory/iatrogenic  . Liver disease Mother        hepatic insufficiency  . OCD Mother   . Breast cancer Mother   . Peripheral vascular disease Brother   . Clotting disorder Paternal Grandfather   . Pulmonary embolism Paternal Grandfather   . Bipolar disorder Unknown        sister & brother  . Depression Unknown        sister & brother  . Heart attack Maternal Grandmother        in 18s  . Depression Other   . Asthma Neg Hx   . Diabetes Neg Hx   . Stroke Neg Hx     Review of Systems  Constitutional: Negative for chills and fever.  HENT: Negative for congestion, ear pain, sinus pressure, sinus pain and sore throat.   Respiratory: Negative for cough, shortness of breath and  wheezing.   Musculoskeletal: Positive for arthralgias (at baseline).  Skin: Negative for color  change and rash.  Neurological: Positive for headaches. Negative for dizziness and light-headedness.       Objective:   Vitals:   04/19/18 1037  BP: 138/88  Pulse: 76  Resp: 16  Temp: 98.2 F (36.8 C)  SpO2: 98%   BP Readings from Last 3 Encounters:  04/19/18 138/88  03/18/18 124/74  10/02/17 116/76   Wt Readings from Last 3 Encounters:  04/19/18 136 lb 6.4 oz (61.9 kg)  03/18/18 136 lb (61.7 kg)  10/02/17 143 lb (64.9 kg)   Body mass index is 23.41 kg/m.   Physical Exam    GENERAL APPEARANCE: Appears stated age, well appearing, NAD EYES: conjunctiva clear, no icterus HEENT: bilateral tympanic membranes and ear canals normal, oropharynx with no erythema, mild b/l TMJ pain, especially with opening her mouth, no thyromegaly, trachea midline, mild anterior b/l cervical , no auricular, occipital, posterior cervical or  supraclavicular lymphadenopathy LUNGS: Clear to auscultation without wheeze or crackles, unlabored breathing, good air entry bilaterally CARDIOVASCULAR: Normal S1,S2 without murmurs, no edema SKIN: Warm, dry      Assessment & Plan:    See Problem List for Assessment and Plan of chronic medical problems.

## 2018-04-19 NOTE — Assessment & Plan Note (Signed)
Symptoms c/w TMJ Advised taking advil for a few days, but not long term - this should help May need to see her dentist again Already on a muscle relaxer Avoid opening mouth wide or excessive chewing

## 2018-04-19 NOTE — Patient Instructions (Signed)
  Test(s) ordered today. Your results will be released to [place] (or called to you) after review, usually within 72hours after test completion. If any changes need to be made, you will be notified at that same time.  An [place] was ordered.    Try a higher dose of advil for the next few days.   A referral was ordered for ENT  Call if no improvement

## 2018-04-19 NOTE — Assessment & Plan Note (Signed)
Mild Likely reactive Will check cbc, cmp, tsh [place] of neck Referral to ENT

## 2018-04-20 ENCOUNTER — Encounter: Payer: Self-pay | Admitting: Internal Medicine

## 2018-05-03 DIAGNOSIS — M5481 Occipital neuralgia: Secondary | ICD-10-CM | POA: Diagnosis not present

## 2018-05-03 DIAGNOSIS — M5030 Other cervical disc degeneration, unspecified cervical region: Secondary | ICD-10-CM | POA: Diagnosis not present

## 2018-05-03 DIAGNOSIS — M542 Cervicalgia: Secondary | ICD-10-CM | POA: Diagnosis not present

## 2018-05-03 DIAGNOSIS — Z5181 Encounter for therapeutic drug level monitoring: Secondary | ICD-10-CM | POA: Diagnosis not present

## 2018-05-11 ENCOUNTER — Encounter: Payer: Self-pay | Admitting: Internal Medicine

## 2018-05-17 ENCOUNTER — Encounter: Payer: Self-pay | Admitting: Internal Medicine

## 2018-05-31 DIAGNOSIS — M542 Cervicalgia: Secondary | ICD-10-CM | POA: Diagnosis not present

## 2018-05-31 DIAGNOSIS — M5481 Occipital neuralgia: Secondary | ICD-10-CM | POA: Diagnosis not present

## 2018-05-31 DIAGNOSIS — M5030 Other cervical disc degeneration, unspecified cervical region: Secondary | ICD-10-CM | POA: Diagnosis not present

## 2018-05-31 DIAGNOSIS — Z5181 Encounter for therapeutic drug level monitoring: Secondary | ICD-10-CM | POA: Diagnosis not present

## 2018-06-22 DIAGNOSIS — F331 Major depressive disorder, recurrent, moderate: Secondary | ICD-10-CM | POA: Diagnosis not present

## 2018-06-22 DIAGNOSIS — F411 Generalized anxiety disorder: Secondary | ICD-10-CM | POA: Diagnosis not present

## 2018-07-13 DIAGNOSIS — M5030 Other cervical disc degeneration, unspecified cervical region: Secondary | ICD-10-CM | POA: Diagnosis not present

## 2018-07-13 DIAGNOSIS — Z5181 Encounter for therapeutic drug level monitoring: Secondary | ICD-10-CM | POA: Diagnosis not present

## 2018-07-13 DIAGNOSIS — M542 Cervicalgia: Secondary | ICD-10-CM | POA: Diagnosis not present

## 2018-07-13 DIAGNOSIS — M5481 Occipital neuralgia: Secondary | ICD-10-CM | POA: Diagnosis not present

## 2018-07-26 ENCOUNTER — Ambulatory Visit (INDEPENDENT_AMBULATORY_CARE_PROVIDER_SITE_OTHER): Payer: BLUE CROSS/BLUE SHIELD | Admitting: Podiatry

## 2018-07-26 ENCOUNTER — Encounter: Payer: Self-pay | Admitting: Podiatry

## 2018-07-26 DIAGNOSIS — M722 Plantar fascial fibromatosis: Secondary | ICD-10-CM | POA: Diagnosis not present

## 2018-07-26 MED ORDER — TRIAMCINOLONE ACETONIDE 10 MG/ML IJ SUSP
10.0000 mg | Freq: Once | INTRAMUSCULAR | Status: AC
  Administered 2018-07-26: 10 mg

## 2018-07-28 NOTE — Progress Notes (Signed)
Subjective:   Patient ID: Nicole Dunn, female   DOB: 52 y.o.   MRN: [id number]   HPI Patient presents stating that the right heel has been very tender recently and she needs to have it worked on   ROS      Objective:  Physical Exam  Neurovascular status intact with patient found to have exquisite discomfort plantar aspect right heel at the insertional point tendon calcaneus     Assessment:  Acute inflammation right plantar heel at the insertional point tendon calcaneus     Plan:  Injected the plantar fascial right 3 mg Kenalog 5 mg Xylocaine after sterile prep and applied sterile dressing and reappoint for routine care

## 2018-07-29 ENCOUNTER — Encounter: Payer: Self-pay | Admitting: Podiatry

## 2018-07-29 MED ORDER — METHYLPREDNISOLONE 4 MG PO TBPK: ORAL_TABLET | ORAL | 0 refills | Status: DC

## 2018-07-29 NOTE — Telephone Encounter (Signed)
I think six day pack would be ok

## 2018-08-09 DIAGNOSIS — M5030 Other cervical disc degeneration, unspecified cervical region: Secondary | ICD-10-CM | POA: Diagnosis not present

## 2018-08-09 DIAGNOSIS — Z5181 Encounter for therapeutic drug level monitoring: Secondary | ICD-10-CM | POA: Diagnosis not present

## 2018-08-09 DIAGNOSIS — M542 Cervicalgia: Secondary | ICD-10-CM | POA: Diagnosis not present

## 2018-08-09 DIAGNOSIS — M5481 Occipital neuralgia: Secondary | ICD-10-CM | POA: Diagnosis not present

## 2018-09-06 ENCOUNTER — Ambulatory Visit (INDEPENDENT_AMBULATORY_CARE_PROVIDER_SITE_OTHER): Payer: Commercial Managed Care - PPO

## 2018-09-06 ENCOUNTER — Encounter: Payer: Self-pay | Admitting: Podiatry

## 2018-09-06 ENCOUNTER — Other Ambulatory Visit: Payer: Self-pay | Admitting: Podiatry

## 2018-09-06 ENCOUNTER — Ambulatory Visit (INDEPENDENT_AMBULATORY_CARE_PROVIDER_SITE_OTHER): Payer: Commercial Managed Care - PPO | Admitting: Podiatry

## 2018-09-06 DIAGNOSIS — M79671 Pain in right foot: Secondary | ICD-10-CM

## 2018-09-06 DIAGNOSIS — M722 Plantar fascial fibromatosis: Secondary | ICD-10-CM | POA: Diagnosis not present

## 2018-09-07 ENCOUNTER — Ambulatory Visit: Payer: Self-pay

## 2018-09-07 DIAGNOSIS — M722 Plantar fascial fibromatosis: Secondary | ICD-10-CM

## 2018-09-07 DIAGNOSIS — M79676 Pain in unspecified toe(s): Secondary | ICD-10-CM

## 2018-09-08 NOTE — Progress Notes (Signed)
Subjective:   Patient ID: Nicole Dunn, female   DOB: 53 y.o.   MRN: [id number]   HPI Patient states my heel is still really bothering me and it is difficult for me to walk and I want something to try to fix this   ROS      Objective:  Physical Exam  Neurovascular status intact muscle strength is adequate range of motion within normal limits with patient found to have exquisite discomfort plantar aspect right heel at the insertional point tendon calcaneus     Assessment:  Acute plantar fasciitis right inflammation fluid noted     Plan:  H&P conditions reviewed and recommended long-term shockwave therapy would be possible for open surgery if it does not get better.  I did dispense air fracture walker today with all instructions on usage and schedule for shockwave therapy educating her on this and she did have this done on her left heel in the past

## 2018-09-14 NOTE — Progress Notes (Signed)
Patient is here today with complaint of chronic right heel plantar pain this is been ongoing for several months, she says she has tried injection, wearing the boot, physical therapy, stretching and ice.  She states she is not gotten any pain relief so far.  Pain on palpation to right plantar heel.  ESWT administered for 6 J along with a epat to surrounding tissues.  Advised to utilize her boot and avoid NSAIDs and ice.  She is to follow-up next week for second treatment.

## 2018-09-20 ENCOUNTER — Ambulatory Visit: Payer: Commercial Managed Care - PPO

## 2018-09-20 DIAGNOSIS — M79676 Pain in unspecified toe(s): Secondary | ICD-10-CM

## 2018-09-20 DIAGNOSIS — M722 Plantar fascial fibromatosis: Secondary | ICD-10-CM

## 2018-09-21 NOTE — Progress Notes (Signed)
Patient is here today with complaint of chronic right heel plantar pain this is been ongoing for several months, she says she has tried injection, wearing the boot, physical therapy, stretching and ice.  She states she is not gotten any pain relief so far.  She states that she had an increase in pain/flareup after the first procedure, and then had to work to 12-hour shifts, so her pain increased.  Pain on palpation to right plantar heel.  ESWT administered for 7 J along with a epat to surrounding tissues.  Advised to utilize her boot and avoid NSAIDs and ice.  She is to follow-up next week for second treatment.

## 2018-09-26 ENCOUNTER — Other Ambulatory Visit: Payer: Self-pay | Admitting: Internal Medicine

## 2018-09-28 ENCOUNTER — Other Ambulatory Visit: Payer: Commercial Managed Care - PPO

## 2018-09-30 ENCOUNTER — Other Ambulatory Visit: Payer: Commercial Managed Care - PPO

## 2018-10-01 ENCOUNTER — Ambulatory Visit: Payer: Self-pay

## 2018-10-01 DIAGNOSIS — B351 Tinea unguium: Secondary | ICD-10-CM

## 2018-10-01 DIAGNOSIS — M722 Plantar fascial fibromatosis: Secondary | ICD-10-CM

## 2018-10-05 NOTE — Progress Notes (Signed)
Patient is here today with complaint of chronic right heel plantar pain this is been ongoing for several months, she says she has tried injection, wearing the boot, physical therapy, stretching and ice.  She states she is not gotten any pain relief so far.  She states that she had an increase in pain/flareup after the first procedure, and then had to work to 12-hour shifts, so her pain increased.  She states she is still wearing the boot at work, but her foot does not hurt until about mid shift.  Pain on palpation to right plantar heel.  ESWT administered for 9.6 J along with a epat to surrounding tissues.  Advised to utilize her boot and avoid NSAIDs and ice.  She is to follow-up in 2 weeks for fourth treatment.

## 2018-10-26 ENCOUNTER — Other Ambulatory Visit: Payer: Commercial Managed Care - PPO

## 2018-10-27 ENCOUNTER — Other Ambulatory Visit: Payer: Self-pay

## 2018-10-27 ENCOUNTER — Ambulatory Visit: Payer: Self-pay

## 2018-10-27 DIAGNOSIS — M722 Plantar fascial fibromatosis: Secondary | ICD-10-CM

## 2018-10-27 DIAGNOSIS — M79676 Pain in unspecified toe(s): Secondary | ICD-10-CM

## 2018-10-29 NOTE — Progress Notes (Signed)
Patient is here today with complaint of chronic right heel plantar pain this is been ongoing for several months, she says she has tried injection, wearing the boot, physical therapy, stretching and ice.  She states she is not gotten any pain relief so far.  She states that her foot was doing fine until se " pivoted on her toes" and the pain returned.    Pain on palpation to right plantar heel.  ESWT administered for 5 J along with a epat to surrounding tissues.  Advised to utilize her boot and avoid NSAIDs and ice.  She is to follow-up in 2 weeks for fourth treatment.

## 2018-11-06 ENCOUNTER — Other Ambulatory Visit: Payer: Self-pay | Admitting: Internal Medicine

## 2018-11-09 ENCOUNTER — Other Ambulatory Visit: Payer: Commercial Managed Care - PPO

## 2018-11-15 ENCOUNTER — Other Ambulatory Visit: Payer: Self-pay

## 2018-11-15 ENCOUNTER — Ambulatory Visit: Payer: Commercial Managed Care - PPO

## 2018-11-15 DIAGNOSIS — M722 Plantar fascial fibromatosis: Secondary | ICD-10-CM

## 2018-11-16 ENCOUNTER — Encounter (INDEPENDENT_AMBULATORY_CARE_PROVIDER_SITE_OTHER): Payer: Commercial Managed Care - PPO | Admitting: Internal Medicine

## 2018-11-16 DIAGNOSIS — J3481 Nasal mucositis (ulcerative): Secondary | ICD-10-CM

## 2018-11-16 MED ORDER — MUPIROCIN 2 % EX OINT: 1.0000 "application " | TOPICAL_OINTMENT | Freq: Two times a day (BID) | CUTANEOUS | 0 refills | Status: DC

## 2018-11-16 NOTE — Addendum Note (Signed)
Addended by: Binnie Rail on: 11/16/2018 04:49 PM   Modules accepted: Orders

## 2018-11-16 NOTE — Telephone Encounter (Signed)
Cumulative time during 7-day interval 5 min , there was not an associated office visit for this concern within a 7 day period.  Patient did provide consent for services prior to services given.  Names of all persons present for services: Binnie Rail, MD, Jorja Loa   Chief complaint: nasal sores  She has had nasal sores inside her nose for 3-4 months and they will not heal.  She has a h/o MRSA years ago.  She has tried topical Bacitracin, Polymaxcin and Neosporin, but non have worked.    History, background, results pertinent:  Past Medical History:  Diagnosis Date  . Anxiety   . Common migraine    with prodrome visual auro, "black spot with blind peripheral ring"  . Degenerative disc disease    with C6-7 encroachment & C3-4 narrowing  . Degenerative disc disease, lumbar    L5-S1, Dr. Niel Hummer, Neurology  . Endometrial hyperplasia 07/10/2011   Dr Lennie Muckle  . GERD (gastroesophageal reflux disease)   . Hypertriglyceridemia 2011   TG 176; HDL 58.9; LDL 121.7  . Nonspecific elevation of levels of transaminase or lactic acid dehydrogenase (LDH) 2011    ALT 61   @results48 @  A/P/next steps: possible MRSA nasal sores, less likely viral etiology  Unable to check nasal culture at this time.  Will empirically prescribe Bactroban ointment and she will use this for 10 days.  She will let me know if this does not help.  May need to see ENT.

## 2018-11-29 ENCOUNTER — Other Ambulatory Visit: Payer: Self-pay

## 2018-11-29 ENCOUNTER — Ambulatory Visit: Payer: Commercial Managed Care - PPO

## 2018-11-29 DIAGNOSIS — M722 Plantar fascial fibromatosis: Secondary | ICD-10-CM

## 2018-12-06 ENCOUNTER — Telehealth: Payer: Self-pay | Admitting: *Deleted

## 2018-12-06 NOTE — Telephone Encounter (Signed)
I called pt and asked how many injections she had received in that plantar fascia and she stated at least 2. I told pt that in some case a 4th injection may be given and I would transfer to scheduler for an appt to discuss with Dr. Paulla Dolly and possibly receive an injection. Transferred pt to scheduler.

## 2018-12-06 NOTE — Telephone Encounter (Signed)
Pt called states she has had the EPAT and is not better, possibly worse and would like to know if she could come in for an injection.

## 2018-12-09 ENCOUNTER — Ambulatory Visit (INDEPENDENT_AMBULATORY_CARE_PROVIDER_SITE_OTHER): Payer: Commercial Managed Care - PPO

## 2018-12-09 ENCOUNTER — Encounter: Payer: Self-pay | Admitting: Podiatry

## 2018-12-09 ENCOUNTER — Other Ambulatory Visit: Payer: Self-pay

## 2018-12-09 ENCOUNTER — Ambulatory Visit (INDEPENDENT_AMBULATORY_CARE_PROVIDER_SITE_OTHER): Payer: Commercial Managed Care - PPO | Admitting: Podiatry

## 2018-12-09 VITALS — Temp 97.5°F

## 2018-12-09 DIAGNOSIS — M722 Plantar fascial fibromatosis: Secondary | ICD-10-CM

## 2018-12-09 MED ORDER — TRIAMCINOLONE ACETONIDE 10 MG/ML IJ SUSP
10.0000 mg | Freq: Once | INTRAMUSCULAR | Status: AC
  Administered 2018-12-09: 10 mg

## 2018-12-09 NOTE — Progress Notes (Signed)
Subjective:   Patient ID: Nicole Dunn, female   DOB: 53 y.o.   MRN: [id number]   HPI Patient presents stating she knows she is getting need surgery on her right that she cannot do it yet and she would like an injection to help her temporarily   ROS      Objective:  Physical Exam  Neurovascular status intact with patient's right plantar fascia still tender when pressed with inability to walk normally on it and quite a bit of inflammation swelling of the medial fascial band     Assessment:  Acute plantar fasciitis right that is been on and off for a fairly long period of time     Plan:  H&P condition reviewed and recommended the continuation of conservative treatment with gradual consideration for surgery that patient wants.  Patient needs to have surgery done but cannot do it right away and is can hold off so I went ahead today and I injected the right fascia at the insertion calcaneus after sterile prep 3 mg Kenalog 5 mg Xylocaine discussed the surgery and she will call when short is able to get time off to do the surgery

## 2018-12-13 ENCOUNTER — Other Ambulatory Visit: Payer: Commercial Managed Care - PPO

## 2018-12-15 NOTE — Progress Notes (Signed)
Patient is here today with complaint of chronic right heel plantar pain this is been ongoing for several months, she says she has tried injection, wearing the boot, physical therapy, stretching and ice.  She states she is not gotten any pain relief so far.  She states that her foot was doing fine until se " pivoted on her toes" and the pain returned.    Pain on palpation to right plantar heel.  ESWT administered for 5 J along with a epat to surrounding tissues.  Advised to utilize her boot and avoid NSAIDs and ice.  She is to follow-up in 2-4 weeks for evaluation with the doctor

## 2019-03-17 LAB — HM PAP SMEAR: HM Pap smear: NEGATIVE

## 2019-03-17 LAB — HM MAMMOGRAPHY

## 2019-03-29 ENCOUNTER — Encounter: Payer: Self-pay | Admitting: Internal Medicine

## 2019-03-29 NOTE — Progress Notes (Signed)
Abstracted and sent to scan  

## 2019-04-07 ENCOUNTER — Telehealth: Payer: Self-pay | Admitting: *Deleted

## 2019-04-07 NOTE — Telephone Encounter (Signed)
"  Please give me a call back today.  I called and left a message yesterday."

## 2019-04-07 NOTE — Telephone Encounter (Signed)
"  I'm a patient of Dr. Paulla Dolly.  He told me to get in contact with you when I was ready to schedule surgery for my foot.  Give me a call back."

## 2019-04-07 NOTE — Telephone Encounter (Signed)
I am returning your call.  I can schedule your tentatively.  You need to schedule a consultation with Dr. Paulla Dolly for the surgery.  "He told me when I was there that the only thing I needed to do I call you when I was ready."  I understand but you still need to see Dr. Paulla Dolly for a consultation so he can go over the surgery, get you to sign the three page consent form, and give you a surgery kit.  "Okay, I would like to schedule my surgery in mid December.  This will allow my time to accumulate on my job."  Dr. Paulla Dolly can do it on July 26, 2019.  "Okay, put me down for then.  I'll call back tomorrow to schedule the appointment with Dr. Paulla Dolly."

## 2019-04-27 ENCOUNTER — Other Ambulatory Visit: Payer: Self-pay

## 2019-04-27 ENCOUNTER — Encounter: Payer: Self-pay | Admitting: Podiatry

## 2019-04-27 ENCOUNTER — Ambulatory Visit (INDEPENDENT_AMBULATORY_CARE_PROVIDER_SITE_OTHER): Payer: Commercial Managed Care - PPO

## 2019-04-27 ENCOUNTER — Ambulatory Visit (INDEPENDENT_AMBULATORY_CARE_PROVIDER_SITE_OTHER): Payer: Commercial Managed Care - PPO | Admitting: Podiatry

## 2019-04-27 DIAGNOSIS — M722 Plantar fascial fibromatosis: Secondary | ICD-10-CM

## 2019-04-27 NOTE — Patient Instructions (Addendum)
Pre-Operative Instructions  Congratulations, you have decided to take an important step towards improving your quality of life.  You can be assured that the doctors and staff at [facility] will be with you every step of the way.  Here are some important things you should know:  1. Plan to be at the surgery center/hospital at least 1 (one) hour prior to your scheduled time, unless otherwise directed by the surgical center/hospital staff.  You must have a responsible adult accompany you, remain during the surgery and drive you home.  Make sure you have directions to the surgical center/hospital to ensure you arrive on time. 2. If you are having surgery at [facility] or [facility], you will need a copy of your medical history and physical form from your family physician within one month prior to the date of surgery. We will give you a form for your primary physician to complete.  3. We make every effort to accommodate the date you request for surgery.  However, there are times where surgery dates or times have to be moved.  We will contact you as soon as possible if a change in schedule is required.   4. No aspirin/ibuprofen for one week before surgery.  If you are on aspirin, any non-steroidal anti-inflammatory medications (Mobic, Aleve, Ibuprofen) should not be taken seven (7) days prior to your surgery.  You make take Tylenol for pain prior to surgery.  5. Medications - If you are taking daily heart and blood pressure medications, seizure, reflux, allergy, asthma, anxiety, pain or diabetes medications, make sure you notify the surgery center/hospital before the day of surgery so they can tell you which medications you should take or avoid the day of surgery. 6. No food or drink after midnight the night before surgery unless directed otherwise by surgical center/hospital staff. 7. No alcoholic beverages 24-hours prior to surgery.  No smoking 24-hours prior or 24-hours after  surgery. 8. Wear loose pants or shorts. They should be loose enough to fit over bandages, boots, and casts. 9. Don't wear slip-on shoes. Sneakers are preferred. 10. Bring your boot with you to the surgery center/hospital.  Also bring crutches or a walker if your physician has prescribed it for you.  If you do not have this equipment, it will be provided for you after surgery. 11. If you have not been contacted by the surgery center/hospital by the day before your surgery, call to confirm the date and time of your surgery. 12. Leave-time from work may vary depending on the type of surgery you have.  Appropriate arrangements should be made prior to surgery with your employer. 13. Prescriptions will be provided immediately following surgery by your doctor.  Fill these as soon as possible after surgery and take the medication as directed. Pain medications will not be refilled on weekends and must be approved by the doctor. 14. Remove nail polish on the operative foot and avoid getting pedicures prior to surgery. 15. Wash the night before surgery.  The night before surgery wash the foot and leg well with water and the antibacterial soap provided. Be sure to pay special attention to beneath the toenails and in between the toes.  Wash for at least three (3) minutes. Rinse thoroughly with water and dry well with a towel.  Perform this wash unless told not to do so by your physician.  Enclosed: 1 Ice pack (please put in freezer the night before surgery)   1 Hibiclens skin cleaner     Pre-op instructions  If you have any questions regarding the instructions, please do not hesitate to call our office.  [place]: [address], [place], [place] [address] -- [phone]  [place]: [address] [place], [place] [address] -- [phone]  [place]: [address]  [place], [place] [address] -- [phone]   Website: [email or web address] 

## 2019-05-02 NOTE — Patient Instructions (Addendum)
Tests ordered today. Your results will be released to [place] (or called to you) after review.  If any changes need to be made, you will be notified at that same time.  All other Health Maintenance issues reviewed.   All recommended immunizations and age-appropriate screenings are up-to-date or discussed.  Tetanus immunization administered today.   Medications reviewed and updated.  Changes include :   none  Your prescription(s) have been submitted to your pharmacy. Please take as directed and contact our office if you believe you are having problem(s) with the medication(s).   Please followup in 1 year    Health Maintenance, Female Adopting a healthy lifestyle and getting preventive care are important in promoting health and wellness. Ask your health care provider about:  The right schedule for you to have regular tests and exams.  Things you can do on your own to prevent diseases and keep yourself healthy. What should I know about diet, weight, and exercise? Eat a healthy diet   Eat a diet that includes plenty of vegetables, fruits, low-fat dairy products, and lean protein.  Do not eat a lot of foods that are high in solid fats, added sugars, or sodium. Maintain a healthy weight Body mass index (BMI) is used to identify weight problems. It estimates body fat based on height and weight. Your health care provider can help determine your BMI and help you achieve or maintain a healthy weight. Get regular exercise Get regular exercise. This is one of the most important things you can do for your health. Most adults should:  Exercise for at least 150 minutes each week. The exercise should increase your heart rate and make you sweat (moderate-intensity exercise).  Do strengthening exercises at least twice a week. This is in addition to the moderate-intensity exercise.  Spend less time sitting. Even light physical activity can be beneficial. Watch cholesterol and blood lipids Have  your blood tested for lipids and cholesterol at 53 years of age, then have this test every 5 years. Have your cholesterol levels checked more often if:  Your lipid or cholesterol levels are high.  You are older than 53 years of age.  You are at high risk for heart disease. What should I know about cancer screening? Depending on your health history and family history, you may need to have cancer screening at various ages. This may include screening for:  Breast cancer.  Cervical cancer.  Colorectal cancer.  Skin cancer.  Lung cancer. What should I know about heart disease, diabetes, and high blood pressure? Blood pressure and heart disease  High blood pressure causes heart disease and increases the risk of stroke. This is more likely to develop in people who have high blood pressure readings, are of African descent, or are overweight.  Have your blood pressure checked: ? Every 3-5 years if you are 3-49 years of age. ? Every year if you are 50 years old or older. Diabetes Have regular diabetes screenings. This checks your fasting blood sugar level. Have the screening done:  Once every three years after age 24 if you are at a normal weight and have a low risk for diabetes.  More often and at a younger age if you are overweight or have a high risk for diabetes. What should I know about preventing infection? Hepatitis B If you have a higher risk for hepatitis B, you should be screened for this virus. Talk with your health care provider to find out if you are at risk  for hepatitis B infection. Hepatitis C Testing is recommended for:  Everyone born from 1945 through 1965.  Anyone with known risk factors for hepatitis C. Sexually transmitted infections (STIs)  Get screened for STIs, including gonorrhea and chlamydia, if: ? You are sexually active and are younger than 53 years of age. ? You are older than 53 years of age and your health care provider tells you that you are at  risk for this type of infection. ? Your sexual activity has changed since you were last screened, and you are at increased risk for chlamydia or gonorrhea. Ask your health care provider if you are at risk.  Ask your health care provider about whether you are at high risk for HIV. Your health care provider may recommend a prescription medicine to help prevent HIV infection. If you choose to take medicine to prevent HIV, you should first get tested for HIV. You should then be tested every 3 months for as long as you are taking the medicine. Pregnancy  If you are about to stop having your period (premenopausal) and you may become pregnant, seek counseling before you get pregnant.  Take 400 to 800 micrograms (mcg) of folic acid every day if you become pregnant.  Ask for birth control (contraception) if you want to prevent pregnancy. Osteoporosis and menopause Osteoporosis is a disease in which the bones lose minerals and strength with aging. This can result in bone fractures. If you are 65 years old or older, or if you are at risk for osteoporosis and fractures, ask your health care provider if you should:  Be screened for bone loss.  Take a calcium or vitamin D supplement to lower your risk of fractures.  Be given hormone replacement therapy (HRT) to treat symptoms of menopause. Follow these instructions at home: Lifestyle  Do not use any products that contain nicotine or tobacco, such as cigarettes, e-cigarettes, and chewing tobacco. If you need help quitting, ask your health care provider.  Do not use street drugs.  Do not share needles.  Ask your health care provider for help if you need support or information about quitting drugs. Alcohol use  Do not drink alcohol if: ? Your health care provider tells you not to drink. ? You are pregnant, may be pregnant, or are planning to become pregnant.  If you drink alcohol: ? Limit how much you use to 0-1 drink a day. ? Limit intake if you  are breastfeeding.  Be aware of how much alcohol is in your drink. In the [place] one drink equals one 12 oz bottle of beer (355 mL), one 5 oz glass of wine (148 mL), or one 1 oz glass of hard liquor (44 mL). General instructions  Schedule regular health, dental, and eye exams.  Stay current with your vaccines.  Tell your health care provider if: ? You often feel depressed. ? You have ever been abused or do not feel safe at home. Summary  Adopting a healthy lifestyle and getting preventive care are important in promoting health and wellness.  Follow your health care provider's instructions about healthy diet, exercising, and getting tested or screened for diseases.  Follow your health care provider's instructions on monitoring your cholesterol and blood pressure. This information is not intended to replace advice given to you by your health care provider. Make sure you discuss any questions you have with your health care provider. Document Released: 02/10/2011 Document Revised: 07/21/2018 Document Reviewed: 07/21/2018 Elsevier Patient Education  2020 Elsevier Inc.  

## 2019-05-02 NOTE — Progress Notes (Signed)
Subjective:    Patient ID: Nicole Dunn, female    DOB: 1966/06/11, 53 y.o.   MRN: [id number]  HPI She is here for a physical exam.   Bactroban helped with a sore in her nose, but she still has a couple of sores that are not healing.  She has tried neosporin, vaseline and bactroban. She has other sores in her nose that seem like a crack that does not heal.  She uses flonase as needed.  She denies nosebleeds.   Medications and allergies reviewed with patient and updated if appropriate.  Patient Active Problem List   Diagnosis Date Noted  . Bilateral temporomandibular joint pain 04/19/2018  . Cervical adenopathy 04/19/2018  . Weight loss, unintentional 03/18/2018  . Lumbar radiculopathy 10/02/2017  . High triglycerides 05/27/2017  . Hyperglycemia 05/24/2017  . PVC (premature ventricular contraction) 07/29/2011  . 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 Anxiety state 12/28/2008  . GERD 12/28/2008  . Migraine without aura 07/23/2007  . Cervical radiculopathy 07/23/2007  . Lumbar pain 07/23/2007    Current Outpatient Medications on File Prior to Visit  Medication Sig Dispense Refill  . buPROPion (WELLBUTRIN XL) 300 MG 24 hr tablet Take 1 tablet (300 mg total) by mouth daily. Must transition care to new provider for refills 90 tablet 0  . busPIRone (BUSPAR) 15 MG tablet TAKE 1 TABLET TWICE DAILY FOR ANXIETY    . Cholecalciferol (VITAMIN D3) 5000 units CAPS Take 1 capsule by mouth daily.    . diclofenac (FLECTOR) 1.3 % PTCH APPLY 1 PATCH TO EACH FOOT TWICE PER DAY FOR ACUTE PAIN WITH SEVERE INFLAMMATION AND SPASM    . estradiol (ESTRACE) 1 MG tablet Take 1 mg by mouth daily.    Marland Kitchen gabapentin (NEURONTIN) 300 MG capsule 600 mg in am 900 mg pm    . gabapentin (NEURONTIN) 600 MG tablet     . HYDROcodone-acetaminophen (NORCO) 7.5-325 MG tablet TAKE 1 TABLET BY MOUTH 4 TIMES A DAY IF TOLERATED  0  . lidocaine (XYLOCAINE) 4 % external solution LIMIT 1-2 DROPS PER NOSTRIL AT THE  FIRST SIGN OF HEADACHE AS DIRECTED  0  . Magnesium 400 MG CAPS Take 400 mg by mouth.    . mupirocin ointment (BACTROBAN) 2 % Place 1 application into the nose 2 (two) times daily. 22 g 0  . Probiotic Product (SOLUBLE FIBER/PROBIOTICS PO) Take by mouth.    . propranolol (INDERAL) 10 MG tablet TAKE 2 TABLETS EVERY 8 TO 12 HOURS AS NEEDED -- OFFICE VISIT NEEDED FOR FURTHER REFILLS 30 tablet 0  . tiZANidine (ZANAFLEX) 2 MG tablet TAKE 1/2 TO 1 TABLET 2 TO 3 TIMES A DAY IF TOLERATED    . traZODone (DESYREL) 50 MG tablet Take 50 mg by mouth at bedtime.     No current facility-administered medications on file prior to visit.     Past Medical History:  Diagnosis Date  . Anxiety   . Common migraine    with prodrome visual auro, "black spot with blind peripheral ring"  . Degenerative disc disease    with C6-7 encroachment & C3-4 narrowing  . Degenerative disc disease, lumbar    L5-S1, Dr. Niel Hummer, Neurology  . Endometrial hyperplasia 07/10/2011   Dr Lennie Muckle  . GERD (gastroesophageal reflux disease)   . Hypertriglyceridemia 2011   TG 176; HDL 58.9; LDL 121.7  . Nonspecific elevation of levels of transaminase or lactic acid dehydrogenase (LDH) 2011    ALT 61  Past Surgical History:  Procedure Laterality Date  . ENDOMETRIAL BIOPSY  05/20/2011   Dr Pamala Hurry   . ENDOSCOPIC PLANTAR FASCIOTOMY  2011   left foot, Dr. Paulla Dolly  . MENISCUS REPAIR  2012   Dr  Noemi Chapel  . otic tubes     bilaterally @age  4  . TONSILLECTOMY    . TOTAL ABDOMINAL HYSTERECTOMY  05/2011   Dr Nancy Marus, [facility]    Social History   Socioeconomic History  . Marital status: Married    Spouse name: Not on file  . Number of children: Not on file  . Years of education: Not on file  . Highest education level: Not on file  Occupational History  . Occupation: Respiratory Therapist  Social Needs  . Financial resource strain: Not on file  . Food insecurity    Worry: Not on file    Inability: Not on file   . Transportation needs    Medical: Not on file    Non-medical: Not on file  Tobacco Use  . Smoking status: Former Smoker    Packs/day: 2.86    Years: 12.00    Pack years: 34.32    Types: Cigarettes    Quit date: 08/11/2006    Years since quitting: 12.7  . Smokeless tobacco: Never Used  . Tobacco comment: smoked 1983-2000, up to 1/2 ppd  Substance and Sexual Activity  . Alcohol use: Yes    Comment: rarely  . Drug use: No  . Sexual activity: Yes  Lifestyle  . Physical activity    Days per week: Not on file    Minutes per session: Not on file  . Stress: Not on file  Relationships  . Social Herbalist on phone: Not on file    Gets together: Not on file    Attends religious service: Not on file    Active member of club or organization: Not on file    Attends meetings of clubs or organizations: Not on file    Relationship status: Not on file  Other Topics Concern  . Not on file  Social History Narrative   exercises    Family History  Problem Relation Age of Onset  . Hypertension Father   . Deep vein thrombosis Father   . Pulmonary embolism Father   . Hypertension Mother   . Kidney failure Mother        transitory/iatrogenic  . Liver disease Mother        hepatic insufficiency  . OCD Mother   . Breast cancer Mother   . Peripheral vascular disease Brother   . Clotting disorder Paternal Grandfather   . Pulmonary embolism Paternal Grandfather   . Bipolar disorder Unknown        sister & brother  . Depression Unknown        sister & brother  . Heart attack Maternal Grandmother        in 58s  . Depression Other   . Asthma Neg Hx   . Diabetes Neg Hx   . Stroke Neg Hx     Review of Systems  Constitutional: Negative for chills and fever.  HENT: Negative for congestion and nosebleeds.        Nasal sores  Eyes: Negative for visual disturbance.  Respiratory: Negative for cough, shortness of breath and wheezing.   Cardiovascular: Negative for chest pain,  palpitations and leg swelling.  Gastrointestinal: Positive for constipation and diarrhea (stools vary). Negative for abdominal pain, blood in stool  and nausea.       GERD occ  Genitourinary: Negative for dysuria and hematuria.  Musculoskeletal: Positive for back pain. Negative for arthralgias (smal rotator cuff tear).  Skin: Negative for color change and rash.  Neurological: Positive for headaches (related to stress). Negative for light-headedness.  Psychiatric/Behavioral: Positive for sleep disturbance. Negative for dysphoric mood. The patient is nervous/anxious.        Objective:   Vitals:   05/03/19 1502  BP: (!) 144/86  Pulse: 81  Resp: 16  Temp: 98.5 F (36.9 C)  SpO2: 98%   Filed Weights   05/03/19 1502  Weight: 141 lb (64 kg)   Body mass index is 24.2 kg/m.  BP Readings from Last 3 Encounters:  05/03/19 (!) 144/86  04/19/18 138/88  03/18/18 124/74    Wt Readings from Last 3 Encounters:  05/03/19 141 lb (64 kg)  04/19/18 136 lb 6.4 oz (61.9 kg)  03/18/18 136 lb (61.7 kg)     Physical Exam Constitutional: She appears well-developed and well-nourished. No distress.  HENT:  Head: Normocephalic and atraumatic.  Right Ear: External ear normal. Normal ear canal and TM Left Ear: External ear normal.  Normal ear canal and TM Mouth/Throat: Oropharynx is clear and moist.  Eyes: Conjunctivae and EOM are normal.  Nose:2 very small  healing ulcers right tip of nasal passageway Neck: Neck supple. No tracheal deviation present. No thyromegaly present. No carotid bruit  Cardiovascular: Normal rate, regular rhythm and normal heart sounds. No murmur heard.  No edema. Pulmonary/Chest: Effort normal and breath sounds normal. No respiratory distress. She has no wheezes. She has no rales.  Breast: deferred   Abdominal: Soft. She exhibits no distension. There is no tenderness.  Lymphadenopathy: She has no cervical adenopathy.  Skin: Skin is warm and dry. She is not  diaphoretic.  Psychiatric: She has a normal mood and affect. Her behavior is normal.        Assessment & Plan:   Physical exam: Screening blood work    ordered Immunizations  Flu vaccine at work, tdap today, discussed shingrix Colonoscopy  Due - - saw Dr Collene Mares - will have in October Mammogram  Up to date  Gyn  Up to date  Eye exams   Up to date  Exercise  Walks a lot at work Weight   Normal BMI Substance abuse  none  See Problem List for Assessment and Plan of chronic medical problems.

## 2019-05-03 ENCOUNTER — Ambulatory Visit (INDEPENDENT_AMBULATORY_CARE_PROVIDER_SITE_OTHER): Payer: Commercial Managed Care - PPO | Admitting: Internal Medicine

## 2019-05-03 ENCOUNTER — Other Ambulatory Visit (INDEPENDENT_AMBULATORY_CARE_PROVIDER_SITE_OTHER): Payer: Commercial Managed Care - PPO

## 2019-05-03 ENCOUNTER — Encounter: Payer: Self-pay | Admitting: Internal Medicine

## 2019-05-03 ENCOUNTER — Other Ambulatory Visit: Payer: Self-pay

## 2019-05-03 VITALS — BP 144/86 | HR 81 | Temp 98.5°F | Resp 16 | Ht 64.0 in | Wt 141.0 lb

## 2019-05-03 DIAGNOSIS — J3489 Other specified disorders of nose and nasal sinuses: Secondary | ICD-10-CM | POA: Insufficient documentation

## 2019-05-03 DIAGNOSIS — F411 Generalized anxiety disorder: Secondary | ICD-10-CM

## 2019-05-03 DIAGNOSIS — Z0000 Encounter for general adult medical examination without abnormal findings: Secondary | ICD-10-CM | POA: Diagnosis not present

## 2019-05-03 DIAGNOSIS — Z23 Encounter for immunization: Secondary | ICD-10-CM

## 2019-05-03 DIAGNOSIS — M545 Low back pain, unspecified: Secondary | ICD-10-CM

## 2019-05-03 DIAGNOSIS — G43009 Migraine without aura, not intractable, without status migrainosus: Secondary | ICD-10-CM

## 2019-05-03 DIAGNOSIS — R739 Hyperglycemia, unspecified: Secondary | ICD-10-CM

## 2019-05-03 LAB — COMPREHENSIVE METABOLIC PANEL
ALT: 21 U/L (ref 0–35)
AST: 18 U/L (ref 0–37)
Albumin: 4.4 g/dL (ref 3.5–5.2)
Alkaline Phosphatase: 49 U/L (ref 39–117)
BUN: 19 mg/dL (ref 6–23)
CO2: 25 mEq/L (ref 19–32)
Calcium: 9.7 mg/dL (ref 8.4–10.5)
Chloride: 105 mEq/L (ref 96–112)
Creatinine, Ser: 0.95 mg/dL (ref 0.40–1.20)
GFR: 61.56 mL/min (ref >60.00)
Glucose, Bld: 91 mg/dL (ref 70–99)
Potassium: 3.6 mEq/L (ref 3.5–5.1)
Sodium: 138 mEq/L (ref 135–145)
Total Bilirubin: 0.3 mg/dL (ref 0.2–1.2)
Total Protein: 7.2 g/dL (ref 6.0–8.3)

## 2019-05-03 LAB — CBC WITH DIFFERENTIAL/PLATELET
Basophils Absolute: 0 10*3/uL (ref 0.0–0.1)
Basophils Relative: 0.5 % (ref 0.0–3.0)
Eosinophils Absolute: 0 10*3/uL (ref 0.0–0.7)
Eosinophils Relative: 0.4 % (ref 0.0–5.0)
HCT: 39.5 % (ref 36.0–46.0)
Hemoglobin: 13.2 g/dL (ref 12.0–15.0)
Lymphocytes Relative: 20.5 % (ref 12.0–46.0)
Lymphs Abs: 1.7 10*3/uL (ref 0.7–4.0)
MCHC: 33.3 g/dL (ref 30.0–36.0)
MCV: 96 fl (ref 78.0–100.0)
Monocytes Absolute: 0.4 10*3/uL (ref 0.1–1.0)
Monocytes Relative: 5.4 % (ref 3.0–12.0)
Neutro Abs: 5.9 10*3/uL (ref 1.4–7.7)
Neutrophils Relative %: 73.2 % (ref 43.0–77.0)
Platelets: 240 10*3/uL (ref 150.0–400.0)
RBC: 4.11 Mil/uL (ref 3.87–5.11)
RDW: 11.9 % (ref 11.5–15.5)
WBC: 8.1 10*3/uL (ref 4.0–10.5)

## 2019-05-03 LAB — LIPID PANEL
Cholesterol: 136 mg/dL (ref 0–200)
HDL: 60 mg/dL (ref >39.00)
LDL Cholesterol: 44 mg/dL (ref 0–99)
NonHDL: 75.75
Total CHOL/HDL Ratio: 2
Triglycerides: 161 mg/dL — ABNORMAL HIGH (ref 0.0–149.0)
VLDL: 32.2 mg/dL (ref 0.0–40.0)

## 2019-05-03 LAB — HEMOGLOBIN A1C: Hgb A1c MFr Bld: 4.8 % (ref 4.6–6.5)

## 2019-05-03 LAB — TSH: TSH: 2.02 u[IU]/mL (ref 0.35–4.50)

## 2019-05-03 MED ORDER — PROPRANOLOL HCL 10 MG PO TABS: ORAL_TABLET | ORAL | 5 refills | Status: DC

## 2019-05-03 NOTE — Assessment & Plan Note (Signed)
Taking buspar Anxiety controlled

## 2019-05-03 NOTE — Assessment & Plan Note (Signed)
Two very small healing ulcers in right nostril, possibly something similar in left - chronic Avoid using flonase too much Saline nasal spray vaseline nightly x 2-3 weeks bactroban has not helped If no improvement can consider referral to ENT

## 2019-05-03 NOTE — Progress Notes (Signed)
Subjective:   Patient ID: Nicole Dunn, female   DOB: 53 y.o.   MRN: [id number]   HPI Patient states my heel has been killing me and I know I am getting surgery in December but I also would like to try to do something temporary as my right one has started to bother me    ROS      Objective:  Physical Exam  Neurovascular status intact muscle strength adequate with exquisite discomfort heel region bilateral with inflammation fluid buildup     Assessment:  Acute fasciitis symptoms bilateral heel that are very painful     Plan:  She cannot do surgery until December so I did go ahead did sterile prep and reinjected the fascia bilateral 3 mg Kenalog 5 mg Xylocaine and then went ahead and discussed surgery reviewing the conditions that will be required.  Patient wants surgery understanding the procedure and at this point went ahead and signed consent form going over the procedures that will be necessary and understanding all risk.  She is encouraged to call with questions concerns and may need to be seen back prior to the procedures  X-rays indicate there is spur formation left also noted with no indication of stress fracture

## 2019-05-03 NOTE — Assessment & Plan Note (Signed)
Following with pain management  Taking norco and gabapentin Pain controlled

## 2019-05-03 NOTE — Assessment & Plan Note (Signed)
Taking propranolol only as needed, which works well - she does not want to take it on a regular basis at this point Continue prn

## 2019-05-03 NOTE — Assessment & Plan Note (Signed)
a1c

## 2019-05-04 ENCOUNTER — Encounter: Payer: Self-pay | Admitting: Internal Medicine

## 2019-05-18 ENCOUNTER — Encounter: Payer: Self-pay | Admitting: Internal Medicine

## 2019-05-24 ENCOUNTER — Telehealth: Payer: Self-pay | Admitting: *Deleted

## 2019-05-24 NOTE — Telephone Encounter (Signed)
"  I'm calling to verify information for a patient of Dr. Paulla Dolly.  Patient's name is Nicole Dunn.  We're processing this patient's short term disability.  We need to verify some information.  Call [employer] back as soon as possible.  The claim number is [id number]."

## 2019-05-24 NOTE — Telephone Encounter (Signed)
Nicole Dunn with [employer] wanted to know if we received the physician statement form. I told her I would have to check to see but that Nicole Dunn is who does all of the paperwork for [employer] and FMLA. I told her that Nicole Dunn is only here on Wednesday's and Thursday's and once she completes it, she will get it faxed back to them. Confirmed patient's surgery date as Tuesday, 26 July 2019.

## 2019-05-30 ENCOUNTER — Encounter: Payer: Self-pay | Admitting: Internal Medicine

## 2019-05-30 LAB — HM COLONOSCOPY

## 2019-05-31 ENCOUNTER — Encounter: Payer: Self-pay | Admitting: Internal Medicine

## 2019-07-04 ENCOUNTER — Telehealth: Payer: Self-pay | Admitting: Podiatry

## 2019-07-04 NOTE — Telephone Encounter (Signed)
DOS: 07/26/2019  SURGICAL PROCEDURE: Endoscopic Plantar Fasciotomy Med Band PPIRJ(18841)  [facility] [employer] Effective Date: 08/11/2018  Deductible is $1,500 with $1,500 met and $0 remaining. Out of Pocket Maximum is $5,200 with $2,705.12 met and $2,494.88 remaining. CoInsurance is 80% / 20%  Prior authorization # [id number].   No referral required per Quillian Quince. Call reference # [id number].

## 2019-07-25 ENCOUNTER — Other Ambulatory Visit: Payer: Self-pay | Admitting: Podiatry

## 2019-07-25 MED ORDER — HYDROCODONE-ACETAMINOPHEN 10-325 MG PO TABS: 1.0000 | ORAL_TABLET | Freq: Three times a day (TID) | ORAL | 0 refills | Status: AC | PRN

## 2019-07-26 ENCOUNTER — Telehealth: Payer: Self-pay | Admitting: Podiatry

## 2019-07-26 ENCOUNTER — Encounter: Payer: Self-pay | Admitting: Podiatry

## 2019-07-26 DIAGNOSIS — M722 Plantar fascial fibromatosis: Secondary | ICD-10-CM

## 2019-07-26 NOTE — Telephone Encounter (Signed)
I'm scheduled to have surgery tomorrow but there is no where in my paperwork that says what time I need to be there. If you would please give me a call back. Thank you.

## 2019-07-27 ENCOUNTER — Telehealth: Payer: Self-pay | Admitting: Podiatry

## 2019-07-27 NOTE — Telephone Encounter (Signed)
Pt had sx yesterday and she is having pain from the gauze that is on top of her incision. Its causing some discomfort. Please call patient

## 2019-07-27 NOTE — Telephone Encounter (Signed)
I spoke with pt and instructed to remove the ace wrap and elevate the foot for 15 minutes, then beginning at the toes rewrap the ace looser rolling down the foot and up the leg in a single layer, if no relief gently tug the gauze at the top of the foot and it will generally loosen and be more comfortable. Pt states understanding.

## 2019-08-01 ENCOUNTER — Encounter: Payer: Self-pay | Admitting: Podiatry

## 2019-08-01 ENCOUNTER — Ambulatory Visit (INDEPENDENT_AMBULATORY_CARE_PROVIDER_SITE_OTHER): Payer: Commercial Managed Care - PPO | Admitting: Podiatry

## 2019-08-01 ENCOUNTER — Other Ambulatory Visit: Payer: Self-pay

## 2019-08-01 VITALS — BP 122/77 | HR 92 | Temp 93.4°F | Resp 16

## 2019-08-01 DIAGNOSIS — M722 Plantar fascial fibromatosis: Secondary | ICD-10-CM | POA: Diagnosis not present

## 2019-08-03 NOTE — Progress Notes (Signed)
Subjective:   Patient ID: Nicole Dunn, female   DOB: 53 y.o.   MRN: [id number]   HPI Patient presents stating I am doing pretty well with mild discomfort more dorsal from the dressing but overall having minimal discomfort no   ROS      Objective:  Physical Exam  Vascular status intact with patient's right foot healing well wound edges well coapted both medial lateral side     Assessment:  Doing well post endoscopic surgery right     Plan:  Advised this patient on continued immobilization elevation compression reapplied sterile dressing reappoint 2 weeks for suture removal or earlier if needed

## 2019-08-11 ENCOUNTER — Other Ambulatory Visit: Payer: Self-pay

## 2019-08-11 DIAGNOSIS — Z20822 Contact with and (suspected) exposure to covid-19: Secondary | ICD-10-CM

## 2019-08-13 LAB — NOVEL CORONAVIRUS, NAA: SARS-CoV-2, NAA: NOT DETECTED

## 2019-08-15 ENCOUNTER — Ambulatory Visit (INDEPENDENT_AMBULATORY_CARE_PROVIDER_SITE_OTHER): Payer: Commercial Managed Care - PPO | Admitting: Podiatry

## 2019-08-15 ENCOUNTER — Encounter: Payer: Self-pay | Admitting: Podiatry

## 2019-08-15 ENCOUNTER — Other Ambulatory Visit: Payer: Self-pay

## 2019-08-15 DIAGNOSIS — M722 Plantar fascial fibromatosis: Secondary | ICD-10-CM

## 2019-08-15 NOTE — Progress Notes (Signed)
Subjective:   Patient ID: Nicole Dunn, female   DOB: 54 y.o.   MRN: [id number]   HPI Patient presents stating I am doing well but do have some pain in my arch   ROS      Objective:  Physical Exam  Neurovascular status intact negative [name]' sign noted wound edges well coapted right heel with mild arch pain noted     Assessment:  Overall doing well post endoscopic release right     Plan:  Reviewed condition and recommended continued boot usage compression stocking stitches removed wound edges coapted well and dressing applied.  Reappoint as needed encouraged to call with any questions concerns

## 2019-08-22 ENCOUNTER — Encounter: Payer: Self-pay | Admitting: Podiatry

## 2019-08-23 ENCOUNTER — Encounter: Payer: Self-pay | Admitting: Podiatry

## 2019-12-19 ENCOUNTER — Other Ambulatory Visit: Payer: Self-pay | Admitting: Internal Medicine

## 2019-12-29 ENCOUNTER — Encounter: Payer: Self-pay | Admitting: Podiatry

## 2020-01-19 ENCOUNTER — Other Ambulatory Visit: Payer: Self-pay | Admitting: Podiatry

## 2020-01-19 ENCOUNTER — Other Ambulatory Visit: Payer: Self-pay

## 2020-01-19 ENCOUNTER — Encounter: Payer: Self-pay | Admitting: Podiatry

## 2020-01-19 ENCOUNTER — Ambulatory Visit (INDEPENDENT_AMBULATORY_CARE_PROVIDER_SITE_OTHER): Payer: Commercial Managed Care - PPO

## 2020-01-19 ENCOUNTER — Ambulatory Visit (INDEPENDENT_AMBULATORY_CARE_PROVIDER_SITE_OTHER): Payer: Commercial Managed Care - PPO | Admitting: Podiatry

## 2020-01-19 DIAGNOSIS — M722 Plantar fascial fibromatosis: Secondary | ICD-10-CM

## 2020-01-19 DIAGNOSIS — M79672 Pain in left foot: Secondary | ICD-10-CM

## 2020-01-19 DIAGNOSIS — M79671 Pain in right foot: Secondary | ICD-10-CM

## 2020-01-19 NOTE — Progress Notes (Signed)
Subjective:   Patient ID: Nicole Dunn, female   DOB: 54 y.o.   MRN: [id number]   HPI Patient states that the inside of the heel where we did the surgery seems okay but the outside of the heel on the bottom has started to bother her and on the left one the inside of the heel is bothering her and making it hard to walk.   ROS      Objective:  Physical Exam  What appears to be lateral band plantar fasciitis right with the left showing acute fasciitis symptomatology     Assessment:  Lateral band plantar fasciitis right medial band plantar fasciitis left     Plan:  H&P reviewed condition I went to the outside of the right did sterile prep and injected the fascia 3 mg Kenalog 5 mg Xylocaine and for the left I did sterile prep injected the fascia 3 mg Kenalog 5 mg Xylocaine and advised supportive shoes and high arches.  Patient will be seen back if symptoms indicate  X-rays today indicated no indication the arch has dropped at all right and there is small spur formation bilateral

## 2020-02-03 ENCOUNTER — Other Ambulatory Visit: Payer: Self-pay | Admitting: Internal Medicine

## 2020-05-25 ENCOUNTER — Telehealth: Payer: Self-pay | Admitting: Internal Medicine

## 2020-05-25 NOTE — Telephone Encounter (Signed)
Recv'd records from [facility] forwarded 2 pages to Dr. Billey Gosling 10/15/21fbg

## 2020-05-28 NOTE — Progress Notes (Signed)
Subjective:    Patient ID: Nicole Dunn, female    DOB: 08-18-1965, 54 y.o.   MRN: [id number]   This visit occurred during the SARS-CoV-2 public health emergency.  Safety protocols were in place, including screening questions prior to the visit, additional usage of staff PPE, and extensive cleaning of exam room while observing appropriate contact time as indicated for disinfecting solutions.    HPI She is here for a physical exam.   She has a torn labrum in her hip - she is seeing ortho and had an injection.   She denies other new issues and has no concerns.   Medications and allergies reviewed with patient and updated if appropriate.  Patient Active Problem List   Diagnosis Date Noted  . Rosacea 05/29/2020  . Nasal sore 05/03/2019  . Bilateral temporomandibular joint pain 04/19/2018  . Cervical adenopathy 04/19/2018  . Lumbar radiculopathy 10/02/2017  . High triglycerides 05/27/2017  . PVC (premature ventricular contraction) 07/29/2011  . 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 Anxiety state 12/28/2008  . GERD 12/28/2008  . Migraine without aura 07/23/2007  . Cervical radiculopathy 07/23/2007  . Lumbar pain 07/23/2007    Current Outpatient Medications on File Prior to Visit  Medication Sig Dispense Refill  . baclofen (LIORESAL) 10 MG tablet Take 10 mg by mouth 2 (two) times daily.    Marland Kitchen buPROPion (WELLBUTRIN XL) 300 MG 24 hr tablet Take 1 tablet (300 mg total) by mouth daily. Must transition care to new provider for refills 90 tablet 0  . busPIRone (BUSPAR) 10 MG tablet Take 10 mg by mouth 2 (two) times daily.    . Cholecalciferol (VITAMIN D3) 5000 units CAPS Take 1 capsule by mouth daily.    . diclofenac (FLECTOR) 1.3 % PTCH APPLY 1 PATCH TO EACH FOOT TWICE PER DAY FOR ACUTE PAIN WITH SEVERE INFLAMMATION AND SPASM    . estradiol (ESTRACE) 1 MG tablet Take 1 mg by mouth daily.    Marland Kitchen gabapentin (NEURONTIN) 600 MG tablet     . HYDROcodone-acetaminophen (NORCO)  10-325 MG tablet SMARTSIG:1 Tablet(s) By Mouth 4-5 Times Daily    . Magnesium 400 MG CAPS Take 400 mg by mouth.    . metroNIDAZOLE (METROCREAM) 0.75 % cream Apply 1 application topically 2 (two) times daily.    . Probiotic Product (SOLUBLE FIBER/PROBIOTICS PO) Take by mouth.    . traZODone (DESYREL) 50 MG tablet Take 50 mg by mouth at bedtime.    . valACYclovir (VALTREX) 500 MG tablet Take 500 mg by mouth daily.     No current facility-administered medications on file prior to visit.    Past Medical History:  Diagnosis Date  . Anxiety   . Common migraine    with prodrome visual auro, "black spot with blind peripheral ring"  . Degenerative disc disease    with C6-7 encroachment & C3-4 narrowing  . Degenerative disc disease, lumbar    L5-S1, Dr. Niel Hummer, Neurology  . Endometrial hyperplasia 07/10/2011   Dr Lennie Muckle  . GERD (gastroesophageal reflux disease)   . Hypertriglyceridemia 2011   TG 176; HDL 58.9; LDL 121.7  . Nonspecific elevation of levels of transaminase or lactic acid dehydrogenase (LDH) 2011    ALT 61    Past Surgical History:  Procedure Laterality Date  . ENDOMETRIAL BIOPSY  05/20/2011   Dr Pamala Hurry   . ENDOSCOPIC PLANTAR FASCIOTOMY  2011   left foot, Dr. Paulla Dolly  . MENISCUS REPAIR  2012   Dr  Wainer  . otic tubes     bilaterally @age  4  . TONSILLECTOMY    . TOTAL ABDOMINAL HYSTERECTOMY  05/2011   Dr [facility], [facility]    Social History   Socioeconomic History  . Marital status: Married    Spouse name: Not on file  . Number of children: Not on file  . Years of education: Not on file  . Highest education level: Not on file  Occupational History  . Occupation: Respiratory Therapist  Tobacco Use  . Smoking status: Former Smoker    Packs/day: 2.86    Years: 12.00    Pack years: 34.32    Types: Cigarettes    Quit date: 08/11/2006    Years since quitting: 13.8  . Smokeless tobacco: Never Used  . Tobacco comment: smoked 1983-2000, up to 1/2  ppd  Substance and Sexual Activity  . Alcohol use: Yes    Comment: rarely  . Drug use: No  . Sexual activity: Yes  Other Topics Concern  . Not on file  Social History Narrative   exercises   Social Determinants of Health   Financial Resource Strain:   . Difficulty of Paying Living Expenses: Not on file  Food Insecurity:   . Worried About Charity fundraiser in the Last Year: Not on file  . Ran Out of Food in the Last Year: Not on file  Transportation Needs:   . Lack of Transportation (Medical): Not on file  . Lack of Transportation (Non-Medical): Not on file  Physical Activity:   . Days of Exercise per Week: Not on file  . Minutes of Exercise per Session: Not on file  Stress:   . Feeling of Stress : Not on file  Social Connections:   . Frequency of Communication with Friends and Family: Not on file  . Frequency of Social Gatherings with Friends and Family: Not on file  . Attends Religious Services: Not on file  . Active Member of Clubs or Organizations: Not on file  . Attends Archivist Meetings: Not on file  . Marital Status: Not on file    Family History  Problem Relation Age of Onset  . Hypertension Father   . Deep vein thrombosis Father   . Pulmonary embolism Father   . Hypertension Mother   . Kidney failure Mother        transitory/iatrogenic  . Liver disease Mother        hepatic insufficiency  . OCD Mother   . Breast cancer Mother   . Peripheral vascular disease Brother   . Clotting disorder Paternal Grandfather   . Pulmonary embolism Paternal Grandfather   . Bipolar disorder Other        sister & brother  . Depression Other        sister & brother  . Heart attack Maternal Grandmother        in 43s  . Depression Other   . Asthma Neg Hx   . Diabetes Neg Hx   . Stroke Neg Hx     Review of Systems  Constitutional: Negative for chills and fever.  Eyes: Negative for visual disturbance.  Respiratory: Negative for cough, shortness of breath  and wheezing.   Cardiovascular: Positive for leg swelling. Negative for chest pain and palpitations.  Gastrointestinal: Positive for constipation. Negative for abdominal pain, blood in stool, diarrhea and nausea.       Gerd freq  Genitourinary: Negative for dysuria and hematuria.  Musculoskeletal:  L Hip, R torn rotator cuff and epicondyle cuff, chronic b/l plantar fasciitis  Skin: Negative for rash.  Neurological: Positive for headaches. Negative for dizziness and light-headedness.  Psychiatric/Behavioral: Positive for dysphoric mood. The patient is nervous/anxious (fair controlled).        Objective:   Vitals:   05/29/20 1438  BP: 106/80  Pulse: 84  Temp: 98.1 F (36.7 C)  SpO2: 98%   Filed Weights   05/29/20 1438  Weight: 141 lb (64 kg)   Body mass index is 24.2 kg/m.  BP Readings from Last 3 Encounters:  05/29/20 106/80  08/01/19 122/77  05/03/19 (!) 144/86    Wt Readings from Last 3 Encounters:  05/29/20 141 lb (64 kg)  05/03/19 141 lb (64 kg)  04/19/18 136 lb 6.4 oz (61.9 kg)     Physical Exam Constitutional: She appears well-developed and well-nourished. No distress.  HENT:  Head: Normocephalic and atraumatic.  Right Ear: External ear normal. Normal ear canal and TM Left Ear: External ear normal.  Normal ear canal and TM Mouth/Throat: Oropharynx is clear and moist.  Eyes: Conjunctivae and EOM are normal.  Neck: Neck supple. No tracheal deviation present. No thyromegaly present.  No carotid bruit  Cardiovascular: Normal rate, regular rhythm and normal heart sounds.   No murmur heard.  No edema. Pulmonary/Chest: Effort normal and breath sounds normal. No respiratory distress. She has no wheezes. She has no rales.  Breast: deferred   Abdominal: Soft. She exhibits no distension. There is no tenderness.  Lymphadenopathy: She has no cervical adenopathy.  Skin: Skin is warm and dry. She is not diaphoretic.  Psychiatric: She has a normal mood and affect.  Her behavior is normal.        Assessment & Plan:   Physical exam: Screening blood work    ordered Immunizations  Had  covid,  Flu vac up to date, advised shingrix Colonoscopy  Up to date  Mammogram  tomorrow Gyn   - [facility] exams  Up to date  Exercise  Walks a lot work Weight  Normal. Substance abuse  none      See Problem List for Assessment and Plan of chronic medical problems.

## 2020-05-28 NOTE — Patient Instructions (Addendum)
Blood work was ordered.    All other Health Maintenance issues reviewed.   All recommended immunizations and age-appropriate screenings are up-to-date or discussed.  shingrix immunization administered today.   Medications reviewed and updated.  Changes include :   none  Your prescription(s) have been submitted to your pharmacy. Please take as directed and contact our office if you believe you are having problem(s) with the medication(s).   Please followup in 1 year    Health Maintenance, Female Adopting a healthy lifestyle and getting preventive care are important in promoting health and wellness. Ask your health care provider about:  The right schedule for you to have regular tests and exams.  Things you can do on your own to prevent diseases and keep yourself healthy. What should I know about diet, weight, and exercise? Eat a healthy diet   Eat a diet that includes plenty of vegetables, fruits, low-fat dairy products, and lean protein.  Do not eat a lot of foods that are high in solid fats, added sugars, or sodium. Maintain a healthy weight Body mass index (BMI) is used to identify weight problems. It estimates body fat based on height and weight. Your health care provider can help determine your BMI and help you achieve or maintain a healthy weight. Get regular exercise Get regular exercise. This is one of the most important things you can do for your health. Most adults should:  Exercise for at least 150 minutes each week. The exercise should increase your heart rate and make you sweat (moderate-intensity exercise).  Do strengthening exercises at least twice a week. This is in addition to the moderate-intensity exercise.  Spend less time sitting. Even light physical activity can be beneficial. Watch cholesterol and blood lipids Have your blood tested for lipids and cholesterol at 54 years of age, then have this test every 5 years. Have your cholesterol levels checked  more often if:  Your lipid or cholesterol levels are high.  You are older than 54 years of age.  You are at high risk for heart disease. What should I know about cancer screening? Depending on your health history and family history, you may need to have cancer screening at various ages. This may include screening for:  Breast cancer.  Cervical cancer.  Colorectal cancer.  Skin cancer.  Lung cancer. What should I know about heart disease, diabetes, and high blood pressure? Blood pressure and heart disease  High blood pressure causes heart disease and increases the risk of stroke. This is more likely to develop in people who have high blood pressure readings, are of African descent, or are overweight.  Have your blood pressure checked: ? Every 3-5 years if you are 34-70 years of age. ? Every year if you are 75 years old or older. Diabetes Have regular diabetes screenings. This checks your fasting blood sugar level. Have the screening done:  Once every three years after age 9 if you are at a normal weight and have a low risk for diabetes.  More often and at a younger age if you are overweight or have a high risk for diabetes. What should I know about preventing infection? Hepatitis B If you have a higher risk for hepatitis B, you should be screened for this virus. Talk with your health care provider to find out if you are at risk for hepatitis B infection. Hepatitis C Testing is recommended for:  Everyone born from [birth year] through 1965.  Anyone with known risk factors for hepatitis C.  Sexually transmitted infections (STIs)  Get screened for STIs, including gonorrhea and chlamydia, if: ? You are sexually active and are younger than 54 years of age. ? You are older than 54 years of age and your health care provider tells you that you are at risk for this type of infection. ? Your sexual activity has changed since you were last screened, and you are at increased risk for  chlamydia or gonorrhea. Ask your health care provider if you are at risk.  Ask your health care provider about whether you are at high risk for HIV. Your health care provider may recommend a prescription medicine to help prevent HIV infection. If you choose to take medicine to prevent HIV, you should first get tested for HIV. You should then be tested every 3 months for as long as you are taking the medicine. Pregnancy  If you are about to stop having your period (premenopausal) and you may become pregnant, seek counseling before you get pregnant.  Take 400 to 800 micrograms (mcg) of folic acid every day if you become pregnant.  Ask for birth control (contraception) if you want to prevent pregnancy. Osteoporosis and menopause Osteoporosis is a disease in which the bones lose minerals and strength with aging. This can result in bone fractures. If you are 36 years old or older, or if you are at risk for osteoporosis and fractures, ask your health care provider if you should:  Be screened for bone loss.  Take a calcium or vitamin D supplement to lower your risk of fractures.  Be given hormone replacement therapy (HRT) to treat symptoms of menopause. Follow these instructions at home: Lifestyle  Do not use any products that contain nicotine or tobacco, such as cigarettes, e-cigarettes, and chewing tobacco. If you need help quitting, ask your health care provider.  Do not use street drugs.  Do not share needles.  Ask your health care provider for help if you need support or information about quitting drugs. Alcohol use  Do not drink alcohol if: ? Your health care provider tells you not to drink. ? You are pregnant, may be pregnant, or are planning to become pregnant.  If you drink alcohol: ? Limit how much you use to 0-1 drink a day. ? Limit intake if you are breastfeeding.  Be aware of how much alcohol is in your drink. In the [place] one drink equals one 12 oz bottle of beer (355  mL), one 5 oz glass of wine (148 mL), or one 1 oz glass of hard liquor (44 mL). General instructions  Schedule regular health, dental, and eye exams.  Stay current with your vaccines.  Tell your health care provider if: ? You often feel depressed. ? You have ever been abused or do not feel safe at home. Summary  Adopting a healthy lifestyle and getting preventive care are important in promoting health and wellness.  Follow your health care provider's instructions about healthy diet, exercising, and getting tested or screened for diseases.  Follow your health care provider's instructions on monitoring your cholesterol and blood pressure. This information is not intended to replace advice given to you by your health care provider. Make sure you discuss any questions you have with your health care provider. Document Revised: 07/21/2018 Document Reviewed: 07/21/2018 Elsevier Patient Education  2020 [employer].

## 2020-05-29 ENCOUNTER — Other Ambulatory Visit: Payer: Self-pay

## 2020-05-29 ENCOUNTER — Ambulatory Visit (INDEPENDENT_AMBULATORY_CARE_PROVIDER_SITE_OTHER): Payer: Commercial Managed Care - PPO | Admitting: Internal Medicine

## 2020-05-29 ENCOUNTER — Encounter: Payer: Self-pay | Admitting: Internal Medicine

## 2020-05-29 VITALS — BP 106/80 | HR 84 | Temp 98.1°F | Ht 64.0 in | Wt 141.0 lb

## 2020-05-29 DIAGNOSIS — Z0000 Encounter for general adult medical examination without abnormal findings: Secondary | ICD-10-CM

## 2020-05-29 DIAGNOSIS — E781 Pure hyperglyceridemia: Secondary | ICD-10-CM

## 2020-05-29 DIAGNOSIS — Z23 Encounter for immunization: Secondary | ICD-10-CM | POA: Diagnosis not present

## 2020-05-29 DIAGNOSIS — S73192A Other sprain of left hip, initial encounter: Secondary | ICD-10-CM | POA: Insufficient documentation

## 2020-05-29 DIAGNOSIS — Z1159 Encounter for screening for other viral diseases: Secondary | ICD-10-CM

## 2020-05-29 DIAGNOSIS — K219 Gastro-esophageal reflux disease without esophagitis: Secondary | ICD-10-CM | POA: Diagnosis not present

## 2020-05-29 DIAGNOSIS — M75101 Unspecified rotator cuff tear or rupture of right shoulder, not specified as traumatic: Secondary | ICD-10-CM

## 2020-05-29 DIAGNOSIS — M75100 Unspecified rotator cuff tear or rupture of unspecified shoulder, not specified as traumatic: Secondary | ICD-10-CM | POA: Insufficient documentation

## 2020-05-29 DIAGNOSIS — F411 Generalized anxiety disorder: Secondary | ICD-10-CM

## 2020-05-29 DIAGNOSIS — L719 Rosacea, unspecified: Secondary | ICD-10-CM

## 2020-05-29 DIAGNOSIS — G43009 Migraine without aura, not intractable, without status migrainosus: Secondary | ICD-10-CM

## 2020-05-29 LAB — CBC WITH DIFFERENTIAL/PLATELET
Basophils Absolute: 0 10*3/uL (ref 0.0–0.1)
Basophils Relative: 0.4 % (ref 0.0–3.0)
Eosinophils Absolute: 0.1 10*3/uL (ref 0.0–0.7)
Eosinophils Relative: 1.2 % (ref 0.0–5.0)
HCT: 36 % (ref 36.0–46.0)
Hemoglobin: 12.1 g/dL (ref 12.0–15.0)
Lymphocytes Relative: 24.2 % (ref 12.0–46.0)
Lymphs Abs: 1.2 10*3/uL (ref 0.7–4.0)
MCHC: 33.6 g/dL (ref 30.0–36.0)
MCV: 96.7 fl (ref 78.0–100.0)
Monocytes Absolute: 0.2 10*3/uL (ref 0.1–1.0)
Monocytes Relative: 5 % (ref 3.0–12.0)
Neutro Abs: 3.4 10*3/uL (ref 1.4–7.7)
Neutrophils Relative %: 69.2 % (ref 43.0–77.0)
Platelets: 174 10*3/uL (ref 150.0–400.0)
RBC: 3.72 Mil/uL — ABNORMAL LOW (ref 3.87–5.11)
RDW: 12 % (ref 11.5–15.5)
WBC: 4.9 10*3/uL (ref 4.0–10.5)

## 2020-05-29 LAB — LIPID PANEL
Cholesterol: 160 mg/dL (ref 0–200)
HDL: 60.1 mg/dL (ref >39.00)
LDL Cholesterol: 69 mg/dL (ref 0–99)
NonHDL: 100.39
Total CHOL/HDL Ratio: 3
Triglycerides: 155 mg/dL — ABNORMAL HIGH (ref 0.0–149.0)
VLDL: 31 mg/dL (ref 0.0–40.0)

## 2020-05-29 LAB — COMPREHENSIVE METABOLIC PANEL
ALT: 17 U/L (ref 0–35)
AST: 16 U/L (ref 0–37)
Albumin: 4.1 g/dL (ref 3.5–5.2)
Alkaline Phosphatase: 39 U/L (ref 39–117)
BUN: 14 mg/dL (ref 6–23)
CO2: 29 mEq/L (ref 19–32)
Calcium: 9 mg/dL (ref 8.4–10.5)
Chloride: 105 mEq/L (ref 96–112)
Creatinine, Ser: 0.83 mg/dL (ref 0.40–1.20)
GFR: 79.96 mL/min (ref >60.00)
Glucose, Bld: 89 mg/dL (ref 70–99)
Potassium: 3.5 mEq/L (ref 3.5–5.1)
Sodium: 140 mEq/L (ref 135–145)
Total Bilirubin: 0.4 mg/dL (ref 0.2–1.2)
Total Protein: 6.7 g/dL (ref 6.0–8.3)

## 2020-05-29 LAB — TSH: TSH: 0.8 u[IU]/mL (ref 0.35–4.50)

## 2020-05-29 MED ORDER — PROPRANOLOL HCL 10 MG PO TABS: ORAL_TABLET | ORAL | 0 refills | Status: DC

## 2020-05-29 NOTE — Addendum Note (Signed)
Addended by: Marcina Millard on: 05/29/2020 03:45 PM   Modules accepted: Orders

## 2020-05-29 NOTE — Assessment & Plan Note (Signed)
Chronic Controlled, stable Continue buspar, bupropion per psychiatry

## 2020-05-29 NOTE — Assessment & Plan Note (Signed)
Chronic Taking omeprazole 20 mg otc daily - overall controlled continue

## 2020-05-29 NOTE — Assessment & Plan Note (Signed)
Chronic Taking propranolol prn - this works well  - continue propranolol 10 mg as needed

## 2020-05-29 NOTE — Assessment & Plan Note (Signed)
Chronic Check lipid panel  Lifestyle controlled Continue increased activity and healthy diet

## 2020-05-29 NOTE — Addendum Note (Signed)
Addended by: Cresenciano Lick on: 05/29/2020 03:19 PM   Modules accepted: Orders

## 2020-05-29 NOTE — Assessment & Plan Note (Signed)
Chronic Using metronidazole cream

## 2020-05-30 LAB — HEPATITIS C ANTIBODY
Hepatitis C Ab: NONREACTIVE
SIGNAL TO CUT-OFF: 0.01 (ref <1.00)

## 2020-05-30 LAB — HM MAMMOGRAPHY

## 2020-06-11 ENCOUNTER — Other Ambulatory Visit: Payer: Self-pay | Admitting: Internal Medicine

## 2020-06-21 ENCOUNTER — Encounter: Payer: Self-pay | Admitting: Internal Medicine

## 2020-06-21 NOTE — Progress Notes (Unsigned)
Outside notes received. Information abstracted. Notes sent to scan.  

## 2020-08-13 ENCOUNTER — Ambulatory Visit: Payer: Commercial Managed Care - PPO

## 2020-08-15 ENCOUNTER — Other Ambulatory Visit: Payer: Self-pay

## 2020-08-15 ENCOUNTER — Ambulatory Visit (INDEPENDENT_AMBULATORY_CARE_PROVIDER_SITE_OTHER): Payer: Commercial Managed Care - PPO

## 2020-08-15 DIAGNOSIS — Z23 Encounter for immunization: Secondary | ICD-10-CM

## 2020-12-31 ENCOUNTER — Other Ambulatory Visit: Payer: Self-pay

## 2020-12-31 ENCOUNTER — Emergency Department (HOSPITAL_COMMUNITY)
Admission: EM | Admit: 2020-12-31 | Discharge: 2021-01-01 | Disposition: A | Discharge status: Home / Self Care | Payer: Commercial Managed Care - PPO | Attending: Emergency Medicine | Admitting: Emergency Medicine

## 2020-12-31 ENCOUNTER — Emergency Department (HOSPITAL_COMMUNITY): Payer: Commercial Managed Care - PPO

## 2020-12-31 ENCOUNTER — Encounter (HOSPITAL_COMMUNITY): Payer: Self-pay

## 2020-12-31 DIAGNOSIS — W228XXA Striking against or struck by other objects, initial encounter: Secondary | ICD-10-CM | POA: Insufficient documentation

## 2020-12-31 DIAGNOSIS — S92511A Displaced fracture of proximal phalanx of right lesser toe(s), initial encounter for closed fracture: Secondary | ICD-10-CM | POA: Insufficient documentation

## 2020-12-31 DIAGNOSIS — S99921A Unspecified injury of right foot, initial encounter: Secondary | ICD-10-CM | POA: Diagnosis present

## 2020-12-31 DIAGNOSIS — Z87891 Personal history of nicotine dependence: Secondary | ICD-10-CM | POA: Insufficient documentation

## 2020-12-31 DIAGNOSIS — Y9301 Activity, walking, marching and hiking: Secondary | ICD-10-CM | POA: Insufficient documentation

## 2020-12-31 NOTE — ED Triage Notes (Signed)
Pt presents from home, states she was walking and struck her pinky toe on R foot against something. Toe angled outward. Pt takes chronic pain medication and took a vicodin around 10pm

## 2021-01-01 ENCOUNTER — Emergency Department (HOSPITAL_COMMUNITY): Payer: Commercial Managed Care - PPO

## 2021-01-01 MED ORDER — OXYCODONE-ACETAMINOPHEN 5-325 MG PO TABS
1.0000 | ORAL_TABLET | Freq: Once | ORAL | Status: AC
  Administered 2021-01-01: 1 via ORAL
  Filled 2021-01-01: qty 1

## 2021-01-01 MED ORDER — LIDOCAINE HCL (PF) 1 % IJ SOLN
10.0000 mL | Freq: Once | INTRAMUSCULAR | Status: AC
  Administered 2021-01-01: 10 mL
  Filled 2021-01-01: qty 30

## 2021-01-01 NOTE — Discharge Instructions (Addendum)
Please read and follow all provided instructions.  You have been seen today after an injury to your right fifth toe Your xray showed a displaced fracture which had improved alignment after the procedure performed in the ED>  We have taped your toes together- please maintain this until you have followed up with orthopedics.  Do not put any weight on this extremity Please call orthopedic surgery tomorrow to schedule an appointment within the next 3 days.   Home care instructions: -- *PRICE in the first 24-48 hours after injury: Protect with splint Rest Ice- Do not apply ice pack directly to your splint place towel or similar between your splint and ice/ice pack. Apply ice for 20 min, then remove for 40 min while awake Compression- splint Elevate affected extremity above the level of your heart when not walking around for the first 24-48 hours    Follow-up instructions: Please follow-up with the orthopedic surgeon in your discharge instructions- call tomorrow to make close follow up appointment.   Return instructions:  Please return if your digits or extremity are numb or tingling, appear gray or blue, or you have severe pain (also elevate the extremity and loosen splint or wrap if you were given one) Please return if you have redness or fevers.  Please return to the Emergency Department if you experience worsening symptoms.  Please return if you have any other emergent concerns. Additional Information:  Your vital signs today were: BP 123/72 (BP Location: Right Arm)   Pulse 73   Temp 97.7 F (36.5 C) (Oral)   Resp 16   Ht 5' 3.5" (1.613 m)   Wt 63.5 kg   SpO2 99%   BMI 24.41 kg/m  If your blood pressure (BP) was elevated above 135/85 this visit, please have this repeated by your doctor within one month. ---------------

## 2021-01-01 NOTE — ED Provider Notes (Signed)
[facility] DEPT Provider Note   CSN: [id number] Arrival date & time: 12/31/20  2218     History Chief Complaint  Patient presents with  . Toe Pain    Nicole Dunn is a 55 y.o. female who presents to the emergency department with complaints of right fifth toe injury which occurred shortly prior to arrival.  Patient was ambulating and struck her foot getting something wedged between her 4th/5th digits causing pain and deformity to the 5th toe. Worse with movement, no alleviating factors. No other areas of injury.   HPI     Past Medical History:  Diagnosis Date  . Anxiety   . Common migraine    with prodrome visual auro, "black spot with blind peripheral ring"  . Degenerative disc disease    with C6-7 encroachment & C3-4 narrowing  . Degenerative disc disease, lumbar    L5-S1, Dr. Niel Hummer, Neurology  . Endometrial hyperplasia 07/10/2011   Dr Lennie Muckle  . GERD (gastroesophageal reflux disease)   . Hypertriglyceridemia 2011   TG 176; HDL 58.9; LDL 121.7  . Nonspecific elevation of levels of transaminase or lactic acid dehydrogenase (LDH) 2011    ALT 61    Patient Active Problem List   Diagnosis Date Noted  . Rosacea 05/29/2020  . Tear of left acetabular labrum 05/29/2020  . Torn rotator cuff 05/29/2020  . Nasal sore 05/03/2019  . Bilateral temporomandibular joint pain 04/19/2018  . Cervical adenopathy 04/19/2018  . Lumbar radiculopathy 10/02/2017  . High triglycerides 05/27/2017  . PVC (premature ventricular contraction) 07/29/2011  . 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 Anxiety state 12/28/2008  . GERD 12/28/2008  . Migraine without aura 07/23/2007  . Cervical radiculopathy 07/23/2007  . Lumbar pain 07/23/2007    Past Surgical History:  Procedure Laterality Date  . ENDOMETRIAL BIOPSY  05/20/2011   Dr Pamala Hurry   . ENDOSCOPIC PLANTAR FASCIOTOMY  2011   left foot, Dr. Paulla Dolly  . MENISCUS REPAIR  2012   Dr   Noemi Chapel  . otic tubes     bilaterally @age  4  . TONSILLECTOMY    . TOTAL ABDOMINAL HYSTERECTOMY  05/2011   Dr Nancy Marus, [facility]     OB History    Gravida  2   Para  2   Term      Preterm      AB      Living        SAB      IAB      Ectopic      Multiple      Live Births              Family History  Problem Relation Age of Onset  . Hypertension Father   . Deep vein thrombosis Father   . Pulmonary embolism Father   . Hypertension Mother   . Kidney failure Mother        transitory/iatrogenic  . Liver disease Mother        hepatic insufficiency  . OCD Mother   . Breast cancer Mother   . Peripheral vascular disease Brother   . Clotting disorder Paternal Grandfather   . Pulmonary embolism Paternal Grandfather   . Bipolar disorder Other        sister & brother  . Depression Other        sister & brother  . Heart attack Maternal Grandmother        in 43s  . Depression Other   .  Asthma Neg Hx   . Diabetes Neg Hx   . Stroke Neg Hx     Social History   Tobacco Use  . Smoking status: Former Smoker    Packs/day: 2.86    Years: 12.00    Pack years: 34.32    Types: Cigarettes    Quit date: 08/11/2006    Years since quitting: 14.4  . Smokeless tobacco: Never Used  . Tobacco comment: smoked 1983-2000, up to 1/2 ppd  Substance Use Topics  . Alcohol use: Yes    Comment: rarely  . Drug use: No    Home Medications Prior to Admission medications   Medication Sig Start Date End Date Taking? Authorizing Provider  propranolol (INDERAL) 10 MG tablet TAKE 2 TABLETS EVERY 8 TO 12 HOURS AS NEEDED 06/12/20   Binnie Rail, MD  baclofen (LIORESAL) 10 MG tablet Take 10 mg by mouth 2 (two) times daily. 07/11/19   [provider]  buPROPion (WELLBUTRIN XL) 300 MG 24 hr tablet Take 1 tablet (300 mg total) by mouth daily. Must transition care to new provider for refills 08/31/15   Hendricks Limes, MD  busPIRone (BUSPAR) 10 MG tablet Take 10 mg by mouth 2  (two) times daily. 04/05/20   [provider]  Cholecalciferol (VITAMIN D3) 5000 units CAPS Take 1 capsule by mouth daily.    [provider]  diclofenac (FLECTOR) 1.3 % PTCH APPLY 1 PATCH TO EACH FOOT TWICE PER DAY FOR ACUTE PAIN WITH SEVERE INFLAMMATION AND SPASM 05/31/18   [provider]  estradiol (ESTRACE) 1 MG tablet Take 1 mg by mouth daily. 04/09/19   [provider]  gabapentin (NEURONTIN) 600 MG tablet  08/27/18   [provider]  HYDROcodone-acetaminophen (NORCO) 10-325 MG tablet SMARTSIG:1 Tablet(s) By Mouth 4-5 Times Daily 05/27/20   [provider]  Magnesium 400 MG CAPS Take 400 mg by mouth.    [provider]  metroNIDAZOLE (METROCREAM) 0.75 % cream Apply 1 application topically 2 (two) times daily. 02/14/20   [provider]  Probiotic Product (SOLUBLE FIBER/PROBIOTICS PO) Take by mouth.    [provider]  traZODone (DESYREL) 50 MG tablet Take 50 mg by mouth at bedtime.    [provider]  valACYclovir (VALTREX) 500 MG tablet Take 500 mg by mouth daily. 03/17/19   [provider]    Allergies    Rocephin [ceftriaxone sodium in dextrose], Escitalopram oxalate, and Topiramate  Review of Systems   Review of Systems  Constitutional: Negative for chills and fever.  Respiratory: Negative for shortness of breath.   Cardiovascular: Negative for chest pain.  Musculoskeletal: Positive for arthralgias and joint swelling.  Neurological: Negative for weakness.  All other systems reviewed and are negative.   Physical Exam Updated Vital Signs BP 123/72 (BP Location: Right Arm)   Pulse 73   Temp 97.7 F (36.5 C) (Oral)   Resp 16   Ht 5' 3.5" (1.613 m)   Wt 63.5 kg   SpO2 99%   BMI 24.41 kg/m   Physical Exam Vitals and nursing note reviewed.  Constitutional:      General: She is not in acute distress.    Appearance: She is well-developed.  HENT:     Head: Normocephalic and  atraumatic.  Eyes:     General:        Right eye: No discharge.        Left eye: No discharge.     Conjunctiva/sclera: Conjunctivae normal.  Cardiovascular:     Comments: 2+ DP pulses.  Musculoskeletal:     Comments: Lower extremities: Patient has an obvious deformity to the right fifth digit, displaced laterally, no open wounds.  Mild soft tissue swelling and ecchymosis in this location.  Tender to palpation over the fifth digit.  Otherwise nontender.  Neurological:     Mental Status: She is alert.     Comments: Clear speech.  Sensation was intact bilateral lower extremities.  5 out of 5 strength with plantar dorsiflexion bilaterally.  Psychiatric:        Behavior: Behavior normal.        Thought Content: Thought content normal.     ED Results / Procedures / Treatments   Labs (all labs ordered are listed, but only abnormal results are displayed) Labs Reviewed - No data to display  EKG None  Radiology DG Toe 5th Right  Result Date: 01/01/2021 CLINICAL DATA:  Right fifth digit fracture, post reduction imaging EXAM: RIGHT FIFTH TOE COMPARISON:  None. FINDINGS: Three view radiograph of the right fifth digit demonstrates interval closed reduction of the proximal phalangeal fracture with fracture fragments in near anatomic alignment. Buddy taping of the fourth and fifth digits is noted. No other changes are seen. IMPRESSION: Interval closed reduction of right fifth proximal phalangeal fracture with fracture fragments in near anatomic alignment. Electronically Signed   By: Fidela Salisbury MD   On: 01/01/2021 04:18   DG Toe 5th Right  Result Date: 12/31/2020 CLINICAL DATA:  Left fifth toe injury EXAM: RIGHT FIFTH TOE COMPARISON:  None. FINDINGS: Three view radiograph left fifth toe demonstrates an acute, extra-articular, oblique fracture of the diaphysis of the right fifth proximal phalanx with moderate lateral angulation of the distal fracture fragment. Joint spaces are preserved. No other  fracture or dislocation. Moderate surrounding soft tissue swelling. IMPRESSION: Laterally angulated oblique diaphyseal extra-articular fracture of the proximal phalanx of the right fifth digit. Electronically Signed   By: Fidela Salisbury MD   On: 12/31/2020 22:58    Procedures Reduction of fracture  Date/Time: 01/01/2021 4:17 AM Performed by: Amaryllis Dyke, PA-C Authorized by: Amaryllis Dyke, PA-C  Consent: Verbal consent obtained. Risks and benefits: risks, benefits and alternatives were discussed Consent given by: patient Required items: required blood products, implants, devices, and special equipment available Local anesthesia used: yes Anesthesia: digital block  Anesthesia: Local anesthesia used: yes Local Anesthetic: lidocaine 1% without epinephrine Anesthetic total: 8 mL  Sedation: Patient sedated: no  Patient tolerance: patient tolerated the procedure well with no immediate complications      Medications Ordered in ED Medications  lidocaine (PF) (XYLOCAINE) 1 % injection 10 mL (has no administration in time range)  oxyCODONE-acetaminophen (PERCOCET/ROXICET) 5-325 MG per tablet 1 tablet (1 tablet Oral Given 01/01/21 0210)    ED Course  I have reviewed the triage vital signs and the nursing notes.  Pertinent labs & imaging results that were available during my care of the patient were reviewed by me and considered in my medical decision making (see chart for details).    MDM Rules/Calculators/A&P                         Patient presents to the emergency department with right fifth digit injury of the foot.  Nontoxic, resting comfortably, vital slight significant abnormality.  On exam there is an obvious deformity, x-ray confirms Laterally angulated oblique diaphyseal extra-articular fracture of the proximal phalanx of the right fifth  digit.  Digital block performed, subsequently reduced, repeat imaging improved.  I personally reviewed and interpreted  imaging.  Toes buddy taped with postop shoe.  She states has Vicodin she can take at home.  Discussed PRICE & orthopedics follow up. I discussed results, treatment plan, need for follow-up, and return precautions with the patient. Provided opportunity for questions, patient confirmed understanding and is in agreement with plan.   Findings and plan of care discussed with supervising physician Dr. Stark Jock who is in agreement.   Final Clinical Impression(s) / ED Diagnoses Final diagnoses:  Closed displaced fracture of proximal phalanx of lesser toe of right foot, initial encounter    Rx / DC Orders ED Discharge Orders    None       Amaryllis Dyke, PA-C 01/01/21 0436    Veryl Speak, MD 01/02/21 [phone]

## 2021-01-17 ENCOUNTER — Ambulatory Visit (INDEPENDENT_AMBULATORY_CARE_PROVIDER_SITE_OTHER): Payer: Commercial Managed Care - PPO

## 2021-01-17 ENCOUNTER — Encounter: Payer: Self-pay | Admitting: Podiatry

## 2021-01-17 ENCOUNTER — Other Ambulatory Visit: Payer: Self-pay

## 2021-01-17 ENCOUNTER — Ambulatory Visit (INDEPENDENT_AMBULATORY_CARE_PROVIDER_SITE_OTHER): Payer: Commercial Managed Care - PPO | Admitting: Podiatry

## 2021-01-17 DIAGNOSIS — S92515A Nondisplaced fracture of proximal phalanx of left lesser toe(s), initial encounter for closed fracture: Secondary | ICD-10-CM | POA: Diagnosis not present

## 2021-01-17 DIAGNOSIS — M79675 Pain in left toe(s): Secondary | ICD-10-CM | POA: Diagnosis not present

## 2021-01-22 NOTE — Progress Notes (Signed)
Subjective:   Patient ID: Nicole Dunn, female   DOB: 55 y.o.   MRN: [id number]   HPI Patient presents stating the second digit left is swollen at the distal joint and she traumatized it and is concerned about fracture.  States that occurred 01/01/2021   ROS      Objective:  Physical Exam  Neurovascular status intact with inflammation pain second digit left at the distal joint painful when palpated     Assessment:  Fracture of the second digit left distal probable versus contusion     Plan:  H&P reviewed x-rays recommended immobilization and rigid bottom shoes ice therapy and it should get better but will probably take 8 to 12 weeks.  Reappoint as needed  X-rays indicate fracture of the head of the proximal phalanx digit to left stable

## 2021-02-09 ENCOUNTER — Other Ambulatory Visit: Payer: Self-pay | Admitting: Internal Medicine

## 2021-02-12 NOTE — Telephone Encounter (Signed)
Her insurance does not want to pay for the propranolol as prescribed.  Please call her and see how much she is taking and how often-I can change the prescription so hopefully it is covered by her insurance

## 2021-02-13 ENCOUNTER — Other Ambulatory Visit: Payer: Self-pay | Admitting: Internal Medicine

## 2021-03-06 ENCOUNTER — Other Ambulatory Visit: Payer: Self-pay | Admitting: Internal Medicine

## 2021-03-13 ENCOUNTER — Ambulatory Visit (INDEPENDENT_AMBULATORY_CARE_PROVIDER_SITE_OTHER): Payer: Commercial Managed Care - PPO | Admitting: Podiatry

## 2021-03-13 ENCOUNTER — Ambulatory Visit (INDEPENDENT_AMBULATORY_CARE_PROVIDER_SITE_OTHER): Payer: Commercial Managed Care - PPO

## 2021-03-13 ENCOUNTER — Other Ambulatory Visit: Payer: Self-pay

## 2021-03-13 ENCOUNTER — Encounter: Payer: Self-pay | Admitting: Podiatry

## 2021-03-13 DIAGNOSIS — S92515A Nondisplaced fracture of proximal phalanx of left lesser toe(s), initial encounter for closed fracture: Secondary | ICD-10-CM

## 2021-03-14 NOTE — Progress Notes (Signed)
Subjective:   Patient ID: Nicole Dunn, female   DOB: 55 y.o.   MRN: [id number]   HPI Patient is concerned because still having swelling and pain in the left second toe after having fracture several months ago   ROS      Objective:  Physical Exam  Neurovascular status intact with moderate swelling still noted second toe left with slight lateral deviation at the joint with discomfort when I try to move the toe at the proximal to phalangeal joint     Assessment:  History of fracture left second toe with possibility for low-grade arthritis or inflammatory condition secondary to healing     Plan:  H&P x-ray reviewed and I discussed this with her explaining the fact that it still may take time for all the swelling to work itself out but it should gradually do this over the next month or 2 and if not we will consider injection or possibly will require digital fusion.  Reappoint as symptoms indicate  X-rays indicate that there is some irritation around the joint surface but the joint looks healthy overall it should heal uneventfully

## 2021-03-27 ENCOUNTER — Other Ambulatory Visit: Payer: Self-pay | Admitting: Internal Medicine

## 2021-03-27 NOTE — Telephone Encounter (Signed)
Patient still has refills left on previous script

## 2021-06-02 NOTE — Progress Notes (Signed)
Subjective:    Patient ID: Nicole Dunn, female    DOB: [birth year], 55 y.o.   MRN: [id number]   This visit occurred during the SARS-CoV-2 public health emergency.  Safety protocols were in place, including screening questions prior to the visit, additional usage of staff PPE, and extensive cleaning of exam room while observing appropriate contact time as indicated for disinfecting solutions.    HPI She is here for a physical exam.     Medications and allergies reviewed with patient and updated if appropriate.  Patient Active Problem List   Diagnosis Date Noted  . COVID-19 06/17/2021  . Rosacea 05/29/2020  . Tear of left acetabular labrum 05/29/2020  . Torn rotator cuff 05/29/2020  . Bilateral temporomandibular joint pain 04/19/2018  . Cervical adenopathy 04/19/2018  . Lumbar radiculopathy 10/02/2017  . High triglycerides 05/27/2017  . PVC (premature ventricular contraction) 07/29/2011  . 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 Anxiety state 12/28/2008  . GERD 12/28/2008  . Migraine without aura 07/23/2007  . Cervical radiculopathy 07/23/2007  . Lumbar pain 07/23/2007    Current Outpatient Medications on File Prior to Visit  Medication Sig Dispense Refill  . baclofen (LIORESAL) 10 MG tablet Take 10 mg by mouth 2 (two) times daily.    Marland Kitchen buPROPion (WELLBUTRIN XL) 300 MG 24 hr tablet Take 1 tablet (300 mg total) by mouth daily. Must transition care to new provider for refills 90 tablet 0  . busPIRone (BUSPAR) 10 MG tablet Take 10 mg by mouth 2 (two) times daily.    . Cholecalciferol (VITAMIN D3) 5000 units CAPS Take 1 capsule by mouth daily.    . diclofenac (FLECTOR) 1.3 % PTCH APPLY 1 PATCH TO EACH FOOT TWICE PER DAY FOR ACUTE PAIN WITH SEVERE INFLAMMATION AND SPASM    . estradiol (ESTRACE) 1 MG tablet Take 1 mg by mouth daily.    Marland Kitchen gabapentin (NEURONTIN) 600 MG tablet     . HYDROcodone-acetaminophen (NORCO) 10-325 MG tablet SMARTSIG:1 Tablet(s) By Mouth 4-5 Times  Daily    . Magnesium 400 MG CAPS Take 400 mg by mouth.    . metroNIDAZOLE (METROCREAM) 0.75 % cream Apply 1 application topically 2 (two) times daily.    . traZODone (DESYREL) 50 MG tablet Take 50 mg by mouth at bedtime.    . valACYclovir (VALTREX) 500 MG tablet Take 500 mg by mouth daily.     No current facility-administered medications on file prior to visit.    Past Medical History:  Diagnosis Date  . Anxiety   . Common migraine    with prodrome visual auro, "black spot with blind peripheral ring"  . Degenerative disc disease    with C6-7 encroachment & C3-4 narrowing  . Degenerative disc disease, lumbar    L5-S1, Dr. Niel Hummer, Neurology  . Endometrial hyperplasia 07/10/2011   Dr Lennie Muckle  . GERD (gastroesophageal reflux disease)   . Hypertriglyceridemia 2011   TG 176; HDL 58.9; LDL 121.7  . Nonspecific elevation of levels of transaminase or lactic acid dehydrogenase (LDH) 2011    ALT 61    Past Surgical History:  Procedure Laterality Date  . ENDOMETRIAL BIOPSY  05/20/2011   Dr Pamala Hurry   . ENDOSCOPIC PLANTAR FASCIOTOMY  2011   left foot, Dr. Paulla Dolly  . MENISCUS REPAIR  2012   Dr  Noemi Chapel  . otic tubes     bilaterally @age  4  . TONSILLECTOMY    . TOTAL ABDOMINAL HYSTERECTOMY  05/2011  Dr Nancy Marus, [facility]    Social History   Socioeconomic History  . Marital status: Married    Spouse name: Not on file  . Number of children: Not on file  . Years of education: Not on file  . Highest education level: Not on file  Occupational History  . Occupation: Respiratory Therapist  Tobacco Use  . Smoking status: Former    Packs/day: 2.86    Years: 12.00    Pack years: 34.32    Types: Cigarettes    Quit date: 08/11/2006    Years since quitting: 14.9  . Smokeless tobacco: Never  . Tobacco comments:    smoked 1983-2000, up to 1/2 ppd  Substance and Sexual Activity  . Alcohol use: Yes    Comment: rarely  . Drug use: No  . Sexual activity: Yes  Other  Topics Concern  . Not on file  Social History Narrative   exercises   Social Determinants of Health   Financial Resource Strain: Not on file  Food Insecurity: Not on file  Transportation Needs: Not on file  Physical Activity: Not on file  Stress: Not on file  Social Connections: Not on file    Family History  Problem Relation Age of Onset  . Hypertension Father   . Deep vein thrombosis Father   . Pulmonary embolism Father   . Hypertension Mother   . Kidney failure Mother        transitory/iatrogenic  . Liver disease Mother        hepatic insufficiency  . OCD Mother   . Breast cancer Mother   . Peripheral vascular disease Brother   . Clotting disorder Paternal Grandfather   . Pulmonary embolism Paternal Grandfather   . Bipolar disorder Other        sister & brother  . Depression Other        sister & brother  . Heart attack Maternal Grandmother        in 44s  . Depression Other   . Asthma Neg Hx   . Diabetes Neg Hx   . Stroke Neg Hx     Review of Systems     Objective:  There were no vitals filed for this visit. There were no vitals filed for this visit. There is no height or weight on file to calculate BMI.  BP Readings from Last 3 Encounters:  07/03/21 118/82  01/01/21 131/88  05/29/20 106/80    Wt Readings from Last 3 Encounters:  07/03/21 145 lb (65.8 kg)  12/31/20 140 lb (63.5 kg)  05/29/20 141 lb (64 kg)     Physical Exam Constitutional: She appears well-developed and well-nourished. No distress.  HENT:  Head: Normocephalic and atraumatic.  Right Ear: External ear normal. Normal ear canal and TM Left Ear: External ear normal.  Normal ear canal and TM Mouth/Throat: Oropharynx is clear and moist.  Eyes: Conjunctivae and EOM are normal.  Neck: Neck supple. No tracheal deviation present. No thyromegaly present.  No carotid bruit  Cardiovascular: Normal rate, regular rhythm and normal heart sounds.   No murmur heard.  No  edema. Pulmonary/Chest: Effort normal and breath sounds normal. No respiratory distress. She has no wheezes. She has no rales.  Breast: deferred   Abdominal: Soft. She exhibits no distension. There is no tenderness.  Lymphadenopathy: She has no cervical adenopathy.  Skin: Skin is warm and dry. She is not diaphoretic.  Psychiatric: She has a normal mood and affect. Her behavior is normal.  Lab Results  Component Value Date   WBC 6.0 07/03/2021   HGB 11.7 (L) 07/03/2021   HCT 35.0 (L) 07/03/2021   PLT 233.0 07/03/2021   GLUCOSE 92 07/03/2021   CHOL 156 07/03/2021   TRIG 101.0 07/03/2021   HDL 64.50 07/03/2021   LDLDIRECT 66.0 04/07/2016   LDLCALC 71 07/03/2021   ALT 23 07/03/2021   AST 18 07/03/2021   NA 141 07/03/2021   K 3.9 07/03/2021   CL 107 07/03/2021   CREATININE 0.89 07/03/2021   BUN 14 07/03/2021   CO2 29 07/03/2021   TSH 2.08 07/03/2021   HGBA1C 4.8 05/03/2019         Assessment & Plan:   Physical exam: Screening blood work  ordered Exercise   Weight   Substance abuse  none   Reviewed recommended immunizations.   Health Maintenance  Topic Date Due  . MAMMOGRAM  05/30/2021  . COVID-19 Vaccine (3 - Booster for Moderna series) 07/19/2021 (Originally 12/05/2019)  . PAP SMEAR-Modifier  03/16/2022  . TETANUS/TDAP  05/02/2029  . COLONOSCOPY (Pts 45-59yrs Insurance coverage will need to be confirmed)  05/29/2029  . INFLUENZA VACCINE  Completed  . Hepatitis C Screening  Completed  . HIV Screening  Completed  . Zoster Vaccines- Shingrix  Completed  . Pneumococcal Vaccine 20-70 Years old  Aged Out  . HPV VACCINES  Aged Out          See Problem List for Assessment and Plan of chronic medical problems.      This encounter was created in error - please disregard.

## 2021-06-02 NOTE — Patient Instructions (Addendum)
Blood work was ordered.     Medications changes include :     Your prescription(s) have been submitted to your pharmacy. Please take as directed and contact our office if you believe you are having problem(s) with the medication(s).   A referral was ordered for        Someone from their office will call you to schedule an appointment.    Please followup in 1 year    Health Maintenance, Female Adopting a healthy lifestyle and getting preventive care are important in promoting health and wellness. Ask your health care provider about: The right schedule for you to have regular tests and exams. Things you can do on your own to prevent diseases and keep yourself healthy. What should I know about diet, weight, and exercise? Eat a healthy diet  Eat a diet that includes plenty of vegetables, fruits, low-fat dairy products, and lean protein. Do not eat a lot of foods that are high in solid fats, added sugars, or sodium. Maintain a healthy weight Body mass index (BMI) is used to identify weight problems. It estimates body fat based on height and weight. Your health care provider can help determine your BMI and help you achieve or maintain a healthy weight. Get regular exercise Get regular exercise. This is one of the most important things you can do for your health. Most adults should: Exercise for at least 150 minutes each week. The exercise should increase your heart rate and make you sweat (moderate-intensity exercise). Do strengthening exercises at least twice a week. This is in addition to the moderate-intensity exercise. Spend less time sitting. Even light physical activity can be beneficial. Watch cholesterol and blood lipids Have your blood tested for lipids and cholesterol at 55 years of age, then have this test every 5 years. Have your cholesterol levels checked more often if: Your lipid or cholesterol levels are high. You are older than 55 years of age. You are at high risk for  heart disease. What should I know about cancer screening? Depending on your health history and family history, you may need to have cancer screening at various ages. This may include screening for: Breast cancer. Cervical cancer. Colorectal cancer. Skin cancer. Lung cancer. What should I know about heart disease, diabetes, and high blood pressure? Blood pressure and heart disease High blood pressure causes heart disease and increases the risk of stroke. This is more likely to develop in people who have high blood pressure readings, are of African descent, or are overweight. Have your blood pressure checked: Every 3-5 years if you are 59-10 years of age. Every year if you are 70 years old or older. Diabetes Have regular diabetes screenings. This checks your fasting blood sugar level. Have the screening done: Once every three years after age 28 if you are at a normal weight and have a low risk for diabetes. More often and at a younger age if you are overweight or have a high risk for diabetes. What should I know about preventing infection? Hepatitis B If you have a higher risk for hepatitis B, you should be screened for this virus. Talk with your health care provider to find out if you are at risk for hepatitis B infection. Hepatitis C Testing is recommended for: Everyone born from [birth year] through 1965. Anyone with known risk factors for hepatitis C. Sexually transmitted infections (STIs) Get screened for STIs, including gonorrhea and chlamydia, if: You are sexually active and are younger than 55 years of age.  You are older than 55 years of age and your health care provider tells you that you are at risk for this type of infection. Your sexual activity has changed since you were last screened, and you are at increased risk for chlamydia or gonorrhea. Ask your health care provider if you are at risk. Ask your health care provider about whether you are at high risk for HIV. Your health care  provider may recommend a prescription medicine to help prevent HIV infection. If you choose to take medicine to prevent HIV, you should first get tested for HIV. You should then be tested every 3 months for as long as you are taking the medicine. Pregnancy If you are about to stop having your period (premenopausal) and you may become pregnant, seek counseling before you get pregnant. Take 400 to 800 micrograms (mcg) of folic acid every day if you become pregnant. Ask for birth control (contraception) if you want to prevent pregnancy. Osteoporosis and menopause Osteoporosis is a disease in which the bones lose minerals and strength with aging. This can result in bone fractures. If you are 21 years old or older, or if you are at risk for osteoporosis and fractures, ask your health care provider if you should: Be screened for bone loss. Take a calcium or vitamin D supplement to lower your risk of fractures. Be given hormone replacement therapy (HRT) to treat symptoms of menopause. Follow these instructions at home: Lifestyle Do not use any products that contain nicotine or tobacco, such as cigarettes, e-cigarettes, and chewing tobacco. If you need help quitting, ask your health care provider. Do not use street drugs. Do not share needles. Ask your health care provider for help if you need support or information about quitting drugs. Alcohol use Do not drink alcohol if: Your health care provider tells you not to drink. You are pregnant, may be pregnant, or are planning to become pregnant. If you drink alcohol: Limit how much you use to 0-1 drink a day. Limit intake if you are breastfeeding. Be aware of how much alcohol is in your drink. In the [place] one drink equals one 12 oz bottle of beer (355 mL), one 5 oz glass of wine (148 mL), or one 1 oz glass of hard liquor (44 mL). General instructions Schedule regular health, dental, and eye exams. Stay current with your vaccines. Tell your health  care provider if: You often feel depressed. You have ever been abused or do not feel safe at home. Summary Adopting a healthy lifestyle and getting preventive care are important in promoting health and wellness. Follow your health care provider's instructions about healthy diet, exercising, and getting tested or screened for diseases. Follow your health care provider's instructions on monitoring your cholesterol and blood pressure. This information is not intended to replace advice given to you by your health care provider. Make sure you discuss any questions you have with your health care provider. Document Revised: 10/05/2020 Document Reviewed: 07/21/2018 Elsevier Patient Education  2022 [employer].

## 2021-06-03 ENCOUNTER — Encounter: Payer: Self-pay | Admitting: Internal Medicine

## 2021-06-03 ENCOUNTER — Encounter: Payer: Commercial Managed Care - PPO | Admitting: Internal Medicine

## 2021-06-03 DIAGNOSIS — F411 Generalized anxiety disorder: Secondary | ICD-10-CM

## 2021-06-03 DIAGNOSIS — Z0000 Encounter for general adult medical examination without abnormal findings: Secondary | ICD-10-CM

## 2021-06-03 DIAGNOSIS — G43009 Migraine without aura, not intractable, without status migrainosus: Secondary | ICD-10-CM

## 2021-06-03 NOTE — Assessment & Plan Note (Signed)
Chronic Overall controlled Continue propranolol 10 mg daily as needed

## 2021-06-03 NOTE — Assessment & Plan Note (Signed)
Chronic Management per psychiatry On bupropion and BuSpar Overall controlled

## 2021-06-15 ENCOUNTER — Encounter: Payer: Self-pay | Admitting: Internal Medicine

## 2021-06-17 ENCOUNTER — Telehealth (INDEPENDENT_AMBULATORY_CARE_PROVIDER_SITE_OTHER): Payer: Commercial Managed Care - PPO | Admitting: Nurse Practitioner

## 2021-06-17 ENCOUNTER — Other Ambulatory Visit: Payer: Self-pay

## 2021-06-17 ENCOUNTER — Encounter: Payer: Self-pay | Admitting: Nurse Practitioner

## 2021-06-17 VITALS — HR 84 | Temp 101.0°F

## 2021-06-17 DIAGNOSIS — U071 COVID-19: Secondary | ICD-10-CM | POA: Diagnosis not present

## 2021-06-17 MED ORDER — MOLNUPIRAVIR EUA 200MG CAPSULE: 4.0000 | ORAL_CAPSULE | Freq: Two times a day (BID) | ORAL | 0 refills | Status: AC

## 2021-06-17 NOTE — Progress Notes (Signed)
Patient ID: Nicole Dunn, female    DOB: [birth year], 55 y.o.   MRN: [id number]  Virtual visit completed through [employer], a video enabled telemedicine application. Due to national recommendations of social distancing due to COVID-19, a virtual visit is felt to be most appropriate for this patient at this time. Reviewed limitations, risks, security and privacy concerns of performing a virtual visit and the availability of in person appointments. I also reviewed that there may be a patient responsible charge related to this service. The patient agreed to proceed.   Patient location: home Provider location: [place] [facility], office Persons participating in this virtual visit: patient, provider   If any vitals were documented, they were collected by patient at home unless specified below.    Pulse 84   Temp (!) 101 F (38.3 C)   SpO2 98%    CC: Covid 19 Subjective:   HPI: Nicole Dunn is a 55 y.o. female presenting on 06/17/2021 for Covid Positive (On 06/15/21, sx started on 06/14/21-fever-103 highest, headache, stuffy head, cough, body aches, ears ringing. )    Symptoms started on 06/14/2021 At home covid test atht was positive  Vaccinated with moderna x2 and booster Ibuprofen for fever relife   Relevant past medical, surgical, family and social history reviewed and updated as indicated. Interim medical history since our last visit reviewed. Allergies and medications reviewed and updated. Outpatient Medications Prior to Visit  Medication Sig Dispense Refill   baclofen (LIORESAL) 10 MG tablet Take 10 mg by mouth 2 (two) times daily.     buPROPion (WELLBUTRIN XL) 300 MG 24 hr tablet Take 1 tablet (300 mg total) by mouth daily. Must transition care to new provider for refills 90 tablet 0   busPIRone (BUSPAR) 10 MG tablet Take 10 mg by mouth 2 (two) times daily.     Cholecalciferol (VITAMIN D3) 5000 units CAPS Take 1 capsule by mouth daily.     diclofenac (FLECTOR) 1.3 % PTCH  APPLY 1 PATCH TO EACH FOOT TWICE PER DAY FOR ACUTE PAIN WITH SEVERE INFLAMMATION AND SPASM     estradiol (ESTRACE) 1 MG tablet Take 1 mg by mouth daily.     gabapentin (NEURONTIN) 600 MG tablet      HYDROcodone-acetaminophen (NORCO) 10-325 MG tablet SMARTSIG:1 Tablet(s) By Mouth 4-5 Times Daily     Magnesium 400 MG CAPS Take 400 mg by mouth.     metroNIDAZOLE (METROCREAM) 0.75 % cream Apply 1 application topically 2 (two) times daily.     propranolol (INDERAL) 10 MG tablet TAKE 2 TABLETS EVERY 8 TO 12 HOURS AS NEEDED 90 tablet 2   traZODone (DESYREL) 50 MG tablet Take 50 mg by mouth at bedtime.     valACYclovir (VALTREX) 500 MG tablet Take 500 mg by mouth daily.     No facility-administered medications prior to visit.     Per HPI unless specifically indicated in ROS section below Review of Systems  Constitutional:  Positive for fatigue and fever. Negative for chills.  HENT:  Positive for congestion, postnasal drip, sore throat and tinnitus. Negative for sinus pressure and sinus pain.   Respiratory:  Positive for cough (was yellow now white). Negative for shortness of breath.   Cardiovascular:  Negative for chest pain.  Gastrointestinal:  Negative for abdominal pain, diarrhea, nausea and vomiting.  Musculoskeletal:  Positive for myalgias. Negative for arthralgias.  Neurological:  Positive for dizziness. Negative for weakness, numbness and headaches.  Objective:  Pulse 84  Temp (!) 101 F (38.3 C)   SpO2 98%   Wt Readings from Last 3 Encounters:  12/31/20 140 lb (63.5 kg)  05/29/20 141 lb (64 kg)  05/03/19 141 lb (64 kg)       Physical exam: Gen: alert, NAD, not ill appearing Pulm: speaks in complete sentences without increased work of breathing Psych: normal mood, normal thought content      Results for orders placed or performed in visit on 06/21/20  HM MAMMOGRAPHY  Result Value Ref Range   HM Mammogram 0-4 Bi-Rad 0-4 Bi-Rad, Self Reported Normal   Assessment & Plan:    Problem List Items Addressed This Visit       Other   COVID-19 - Primary    Patient positive for COVID-19.  Discussed medicines and treatment regimens.  Given we will have her kidney function on file in the past 90 days we will opt for molnupiravir.  Discussed with patient that this medication is still EUA only.  Discussed effects of medication.  After discussion and joint decision-making decided to start treatment.  Discussed signs and symptoms and when to seek urgent or emergent health care.  Patient knowledge. Start molnupiravir soon as possible.      Relevant Medications   molnupiravir EUA (LAGEVRIO) 200 mg CAPS capsule     No orders of the defined types were placed in this encounter.  No orders of the defined types were placed in this encounter.   I discussed the assessment and treatment plan with the patient. The patient was provided an opportunity to ask questions and all were answered. The patient agreed with the plan and demonstrated an understanding of the instructions. The patient was advised to call back or seek an in-person evaluation if the symptoms worsen or if the condition fails to improve as anticipated.  Follow up plan: No follow-ups on file.  Romilda Garret, NP

## 2021-06-17 NOTE — Assessment & Plan Note (Signed)
Patient positive for COVID-19.  Discussed medicines and treatment regimens.  Given we will have her kidney function on file in the past 90 days we will opt for molnupiravir.  Discussed with patient that this medication is still EUA only.  Discussed effects of medication.  After discussion and joint decision-making decided to start treatment.  Discussed signs and symptoms and when to seek urgent or emergent health care.  Patient knowledge. Start molnupiravir soon as possible.

## 2021-07-02 NOTE — Progress Notes (Signed)
Subjective:    Patient ID: Nicole Dunn, female    DOB: 22-Sep-1965, 55 y.o.   MRN: [id number]   This visit occurred during the SARS-CoV-2 public health emergency.  Safety protocols were in place, including screening questions prior to the visit, additional usage of staff PPE, and extensive cleaning of exam room while observing appropriate contact time as indicated for disinfecting solutions.    HPI She is here for a physical exam.   She has no concerns.  She had COVID not long ago and has recovered.  Medications and allergies reviewed with patient and updated if appropriate.  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     buPROPion (WELLBUTRIN XL) 300 MG 24 hr tablet Take 1 tablet (300 mg total) by mouth daily. Must transition care to new provider for refills 90 tablet 0   busPIRone (BUSPAR) 10 MG tablet Take 10 mg by mouth 2 (two) times daily.     Cholecalciferol (VITAMIN D3) 5000 units CAPS Take 1 capsule by mouth daily.     diclofenac (FLECTOR) 1.3 % PTCH APPLY 1 PATCH TO EACH FOOT TWICE PER DAY FOR ACUTE PAIN WITH SEVERE INFLAMMATION AND SPASM     estradiol (ESTRACE) 1 MG tablet Take 1 mg by mouth daily.     gabapentin (NEURONTIN) 600 MG tablet       HYDROcodone-acetaminophen (NORCO) 10-325 MG tablet SMARTSIG:1 Tablet(s) By Mouth 4-5 Times Daily     Magnesium 400 MG CAPS Take 400 mg by mouth.     metroNIDAZOLE (METROCREAM) 0.75 % cream Apply 1 application topically 2 (two) times daily.     propranolol (INDERAL) 10 MG tablet TAKE 2 TABLETS EVERY 8 TO 12 HOURS AS NEEDED 90 tablet 2   traZODone (DESYREL) 50 MG tablet Take 50 mg by mouth at bedtime.     valACYclovir (VALTREX) 500 MG tablet Take 500 mg by mouth daily.     No current facility-administered medications on file prior to visit.    Past Medical History:  Diagnosis Date   Anxiety    Common migraine    with prodrome visual auro, "black spot with blind peripheral ring"   Degenerative disc disease    with C6-7 encroachment & C3-4 narrowing   Degenerative disc disease, lumbar    L5-S1, Dr. Niel Hummer, Neurology   Endometrial hyperplasia 07/10/2011   Dr Lennie Muckle   GERD (gastroesophageal reflux disease)    Hypertriglyceridemia 2011   TG 176; HDL 58.9; LDL 121.7   Nonspecific elevation of levels of transaminase or lactic acid dehydrogenase (LDH) 2011    ALT 61    Past Surgical History:  Procedure Laterality Date   ENDOMETRIAL BIOPSY  05/20/2011   Dr Pamala Hurry    ENDOSCOPIC PLANTAR FASCIOTOMY  2011   left foot,  Dr. Paulla Dolly   MENISCUS REPAIR  2012   Dr  Noemi Chapel   otic tubes     bilaterally @age  4   TONSILLECTOMY     TOTAL ABDOMINAL HYSTERECTOMY  05/2011   Dr Nancy Marus, [facility]    Social History   Socioeconomic History   Marital status: Married    Spouse name: Not on file   Number of children: Not on file   Years of education: Not on file   Highest education level: Not on file  Occupational History   Occupation: Respiratory Therapist  Tobacco Use   Smoking status: Former    Packs/day: 2.86    Years: 12.00    Pack years: 34.32    Types: Cigarettes    Quit date: 08/11/2006    Years since quitting: 14.9   Smokeless tobacco: Never   Tobacco comments:     smoked 1983-2000, up to 1/2 ppd  Substance and Sexual Activity   Alcohol use: Yes    Comment: rarely   Drug use: No   Sexual activity: Yes  Other Topics Concern   Not on file  Social History Narrative   exercises   Social Determinants of Health   Financial Resource Strain: Not on file  Food Insecurity: Not on file  Transportation Needs: Not on file  Physical Activity: Not on file  Stress: Not on file  Social Connections: Not on file    Family History  Problem Relation Age of Onset   Hypertension Father    Deep vein thrombosis Father    Pulmonary embolism Father    Hypertension Mother    Kidney failure Mother        transitory/iatrogenic   Liver disease Mother        hepatic insufficiency   OCD Mother    Breast cancer Mother    Peripheral vascular disease Brother    Clotting disorder Paternal Grandfather    Pulmonary embolism Paternal Grandfather    Bipolar disorder Other        sister & brother   Depression Other        sister & brother   Heart attack Maternal Grandmother        in 47s   Depression Other    Asthma Neg Hx    Diabetes Neg Hx    Stroke Neg Hx     Review of Systems  Constitutional:  Negative for chills and fever.  Eyes:  Negative for visual disturbance.  Respiratory:  Negative for cough, shortness of breath and wheezing.   Cardiovascular:  Negative for chest pain, palpitations and leg swelling (with standing long periods).  Gastrointestinal:  Positive for constipation and diarrhea (variation - normal for her IBS). Negative for abdominal pain, blood in stool and nausea.       Occ gerd  Genitourinary:  Negative for dysuria and hematuria.  Musculoskeletal:  Positive for arthralgias and neck pain. Negative for back pain.  Skin:  Negative for color change and rash.  Neurological:  Positive for headaches (occ). Negative for dizziness and light-headedness.  Psychiatric/Behavioral:  Positive for dysphoric mood. The patient is nervous/anxious.        Objective:   Vitals:   07/03/21 1338  BP: 118/82  Pulse: 70  Temp: 98.3 F (36.8 C)  SpO2: 99%   Filed Weights   07/03/21 1338  Weight: 145 lb (65.8 kg)   Body mass index is 25.28 kg/m.  BP Readings from Last 3 Encounters:  07/03/21 118/82  01/01/21 131/88  05/29/20  106/80    Wt Readings from Last 3 Encounters:  07/03/21 145 lb (65.8 kg)  12/31/20 140 lb (63.5 kg)  05/29/20 141 lb (64 kg)   Depression screen [facility] 2/9 07/03/2021 05/27/2017 03/23/2013  Decreased Interest 1 0 1  Down, Depressed, Hopeless 0 0 0  PHQ - 2 Score 1 0 1  Altered sleeping 0 - -  Tired, decreased energy 1 - -  Change in appetite 0 - -  Feeling bad or failure about yourself  0 - -  Trouble concentrating 0 - -  Moving slowly or fidgety/restless 0 - -  Suicidal thoughts 0 - -  PHQ-9 Score 2 - -    GAD 7 : Generalized Anxiety Score 07/03/2021  Nervous, Anxious, on Edge 1  Control/stop worrying 0  Worry too much - different things 0  Trouble relaxing 0  Restless 0  Easily annoyed or irritable 0  Afraid - awful might happen 0  Total GAD 7 Score 1  Anxiety Difficulty Not difficult at all       Physical Exam Constitutional: She appears well-developed and well-nourished. No distress.  HENT:  Head: Normocephalic and atraumatic.  Right Ear: External ear normal. Normal ear canal and TM Left Ear: External ear normal.  Normal ear canal and TM Mouth/Throat: Oropharynx is clear and moist.  Eyes: Conjunctivae and EOM are normal.  Neck: Neck supple. No tracheal deviation present. No thyromegaly present.  No carotid bruit  Cardiovascular: Normal rate, regular rhythm and normal heart sounds.   No murmur heard.  No edema. Pulmonary/Chest: Effort normal and breath sounds normal. No respiratory distress. She has no wheezes. She has no rales.  Breast: deferred   Abdominal: Soft. She exhibits no distension. There is no tenderness.  Lymphadenopathy: She has no cervical adenopathy.  Skin: Skin is  warm and dry. She is not diaphoretic.  Psychiatric: She has a normal mood and affect. Her behavior is normal.     Lab Results  Component Value Date   WBC 4.9 05/29/2020   HGB 12.1 05/29/2020   HCT 36.0 05/29/2020   PLT 174.0 05/29/2020   GLUCOSE 89 05/29/2020   CHOL 160 05/29/2020   TRIG 155.0 (H) 05/29/2020   HDL 60.10 05/29/2020   LDLDIRECT 66.0 04/07/2016   LDLCALC 69 05/29/2020   ALT 17 05/29/2020   AST 16 05/29/2020   NA 140 05/29/2020   K 3.5 05/29/2020   CL 105 05/29/2020   CREATININE 0.83 05/29/2020   BUN 14 05/29/2020   CO2 29 05/29/2020   TSH 0.80 05/29/2020   HGBA1C 4.8 05/03/2019         Assessment & Plan:   Physical exam: Screening blood work  ordered Exercise  joined the y  - not regular Weight  good Substance abuse  none   Screened for depression using the PHQ 9 scale.  No evidence of depression.    Reviewed recommended immunizations.   Health Maintenance  Topic Date Due   MAMMOGRAM  05/30/2021   COVID-19 Vaccine (3 - Booster for Moderna series) 07/19/2021 (Originally 12/05/2019)   PAP SMEAR-Modifier  03/16/2022   TETANUS/TDAP  05/02/2029   COLONOSCOPY (Pts 45-55yrs Insurance coverage will need to be confirmed)  05/29/2029   INFLUENZA VACCINE  Completed   Hepatitis C Screening  Completed   HIV Screening  Completed   Zoster Vaccines- Shingrix  Completed   Pneumococcal Vaccine 57-50 Years old  Aged Out   HPV VACCINES  Aged Out  See Problem List for Assessment and Plan of chronic medical problems.

## 2021-07-02 NOTE — Patient Instructions (Addendum)
Blood work was ordered.     Medications changes include :   none  Your prescription(s) have been submitted to your pharmacy. Please take as directed and contact our office if you believe you are having problem(s) with the medication(s).    Please followup in 1 year  Health Maintenance, Female Adopting a healthy lifestyle and getting preventive care are important in promoting health and wellness. Ask your health care provider about: The right schedule for you to have regular tests and exams. Things you can do on your own to prevent diseases and keep yourself healthy. What should I know about diet, weight, and exercise? Eat a healthy diet  Eat a diet that includes plenty of vegetables, fruits, low-fat dairy products, and lean protein. Do not eat a lot of foods that are high in solid fats, added sugars, or sodium. Maintain a healthy weight Body mass index (BMI) is used to identify weight problems. It estimates body fat based on height and weight. Your health care provider can help determine your BMI and help you achieve or maintain a healthy weight. Get regular exercise Get regular exercise. This is one of the most important things you can do for your health. Most adults should: Exercise for at least 150 minutes each week. The exercise should increase your heart rate and make you sweat (moderate-intensity exercise). Do strengthening exercises at least twice a week. This is in addition to the moderate-intensity exercise. Spend less time sitting. Even light physical activity can be beneficial. Watch cholesterol and blood lipids Have your blood tested for lipids and cholesterol at 55 years of age, then have this test every 5 years. Have your cholesterol levels checked more often if: Your lipid or cholesterol levels are high. You are older than 55 years of age. You are at high risk for heart disease. What should I know about cancer screening? Depending on your health history and family  history, you may need to have cancer screening at various ages. This may include screening for: Breast cancer. Cervical cancer. Colorectal cancer. Skin cancer. Lung cancer. What should I know about heart disease, diabetes, and high blood pressure? Blood pressure and heart disease High blood pressure causes heart disease and increases the risk of stroke. This is more likely to develop in people who have high blood pressure readings or are overweight. Have your blood pressure checked: Every 3-5 years if you are 82-75 years of age. Every year if you are 5 years old or older. Diabetes Have regular diabetes screenings. This checks your fasting blood sugar level. Have the screening done: Once every three years after age 57 if you are at a normal weight and have a low risk for diabetes. More often and at a younger age if you are overweight or have a high risk for diabetes. What should I know about preventing infection? Hepatitis B If you have a higher risk for hepatitis B, you should be screened for this virus. Talk with your health care provider to find out if you are at risk for hepatitis B infection. Hepatitis C Testing is recommended for: Everyone born from [birth year] through 1965. Anyone with known risk factors for hepatitis C. Sexually transmitted infections (STIs) Get screened for STIs, including gonorrhea and chlamydia, if: You are sexually active and are younger than 54 years of age. You are older than 55 years of age and your health care provider tells you that you are at risk for this type of infection. Your sexual activity has changed  since you were last screened, and you are at increased risk for chlamydia or gonorrhea. Ask your health care provider if you are at risk. Ask your health care provider about whether you are at high risk for HIV. Your health care provider may recommend a prescription medicine to help prevent HIV infection. If you choose to take medicine to prevent HIV, you  should first get tested for HIV. You should then be tested every 3 months for as long as you are taking the medicine. Pregnancy If you are about to stop having your period (premenopausal) and you may become pregnant, seek counseling before you get pregnant. Take 400 to 800 micrograms (mcg) of folic acid every day if you become pregnant. Ask for birth control (contraception) if you want to prevent pregnancy. Osteoporosis and menopause Osteoporosis is a disease in which the bones lose minerals and strength with aging. This can result in bone fractures. If you are 63 years old or older, or if you are at risk for osteoporosis and fractures, ask your health care provider if you should: Be screened for bone loss. Take a calcium or vitamin D supplement to lower your risk of fractures. Be given hormone replacement therapy (HRT) to treat symptoms of menopause. Follow these instructions at home: Alcohol use Do not drink alcohol if: Your health care provider tells you not to drink. You are pregnant, may be pregnant, or are planning to become pregnant. If you drink alcohol: Limit how much you have to: 0-1 drink a day. Know how much alcohol is in your drink. In the [place] one drink equals one 12 oz bottle of beer (355 mL), one 5 oz glass of wine (148 mL), or one 1 oz glass of hard liquor (44 mL). Lifestyle Do not use any products that contain nicotine or tobacco. These products include cigarettes, chewing tobacco, and vaping devices, such as e-cigarettes. If you need help quitting, ask your health care provider. Do not use street drugs. Do not share needles. Ask your health care provider for help if you need support or information about quitting drugs. General instructions Schedule regular health, dental, and eye exams. Stay current with your vaccines. Tell your health care provider if: You often feel depressed. You have ever been abused or do not feel safe at home. Summary Adopting a healthy  lifestyle and getting preventive care are important in promoting health and wellness. Follow your health care provider's instructions about healthy diet, exercising, and getting tested or screened for diseases. Follow your health care provider's instructions on monitoring your cholesterol and blood pressure. This information is not intended to replace advice given to you by your health care provider. Make sure you discuss any questions you have with your health care provider. Document Revised: 12/17/2020 Document Reviewed: 12/17/2020 Elsevier Patient Education  [employer].

## 2021-07-03 ENCOUNTER — Encounter: Payer: Self-pay | Admitting: Internal Medicine

## 2021-07-03 ENCOUNTER — Other Ambulatory Visit: Payer: Self-pay

## 2021-07-03 ENCOUNTER — Ambulatory Visit (INDEPENDENT_AMBULATORY_CARE_PROVIDER_SITE_OTHER): Payer: Commercial Managed Care - PPO | Admitting: Internal Medicine

## 2021-07-03 VITALS — BP 118/82 | HR 70 | Temp 98.3°F | Ht 63.5 in | Wt 145.0 lb

## 2021-07-03 DIAGNOSIS — F411 Generalized anxiety disorder: Secondary | ICD-10-CM

## 2021-07-03 DIAGNOSIS — Z0000 Encounter for general adult medical examination without abnormal findings: Secondary | ICD-10-CM | POA: Diagnosis not present

## 2021-07-03 DIAGNOSIS — Z1331 Encounter for screening for depression: Secondary | ICD-10-CM

## 2021-07-03 DIAGNOSIS — E781 Pure hyperglyceridemia: Secondary | ICD-10-CM

## 2021-07-03 DIAGNOSIS — G43009 Migraine without aura, not intractable, without status migrainosus: Secondary | ICD-10-CM

## 2021-07-03 LAB — COMPREHENSIVE METABOLIC PANEL
ALT: 23 U/L (ref 0–35)
AST: 18 U/L (ref 0–37)
Albumin: 4.2 g/dL (ref 3.5–5.2)
Alkaline Phosphatase: 58 U/L (ref 39–117)
BUN: 14 mg/dL (ref 6–23)
CO2: 29 mEq/L (ref 19–32)
Calcium: 9.1 mg/dL (ref 8.4–10.5)
Chloride: 107 mEq/L (ref 96–112)
Creatinine, Ser: 0.89 mg/dL (ref 0.40–1.20)
GFR: 73.17 mL/min (ref >60.00)
Glucose, Bld: 92 mg/dL (ref 70–99)
Potassium: 3.9 mEq/L (ref 3.5–5.1)
Sodium: 141 mEq/L (ref 135–145)
Total Bilirubin: 0.3 mg/dL (ref 0.2–1.2)
Total Protein: 6.8 g/dL (ref 6.0–8.3)

## 2021-07-03 LAB — CBC WITH DIFFERENTIAL/PLATELET
Basophils Absolute: 0 10*3/uL (ref 0.0–0.1)
Basophils Relative: 0.6 % (ref 0.0–3.0)
Eosinophils Absolute: 0.1 10*3/uL (ref 0.0–0.7)
Eosinophils Relative: 1.3 % (ref 0.0–5.0)
HCT: 35 % — ABNORMAL LOW (ref 36.0–46.0)
Hemoglobin: 11.7 g/dL — ABNORMAL LOW (ref 12.0–15.0)
Lymphocytes Relative: 24.2 % (ref 12.0–46.0)
Lymphs Abs: 1.5 10*3/uL (ref 0.7–4.0)
MCHC: 33.4 g/dL (ref 30.0–36.0)
MCV: 95.2 fl (ref 78.0–100.0)
Monocytes Absolute: 0.3 10*3/uL (ref 0.1–1.0)
Monocytes Relative: 5.6 % (ref 3.0–12.0)
Neutro Abs: 4.1 10*3/uL (ref 1.4–7.7)
Neutrophils Relative %: 68.3 % (ref 43.0–77.0)
Platelets: 233 10*3/uL (ref 150.0–400.0)
RBC: 3.68 Mil/uL — ABNORMAL LOW (ref 3.87–5.11)
RDW: 12.4 % (ref 11.5–15.5)
WBC: 6 10*3/uL (ref 4.0–10.5)

## 2021-07-03 LAB — LIPID PANEL
Cholesterol: 156 mg/dL (ref 0–200)
HDL: 64.5 mg/dL (ref >39.00)
LDL Cholesterol: 71 mg/dL (ref 0–99)
NonHDL: 91.59
Total CHOL/HDL Ratio: 2
Triglycerides: 101 mg/dL (ref 0.0–149.0)
VLDL: 20.2 mg/dL (ref 0.0–40.0)

## 2021-07-03 LAB — TSH: TSH: 2.08 u[IU]/mL (ref 0.35–5.50)

## 2021-07-03 MED ORDER — PROPRANOLOL HCL 10 MG PO TABS: ORAL_TABLET | ORAL | 2 refills | Status: DC

## 2021-07-03 NOTE — Assessment & Plan Note (Signed)
Chronic Migraines have been well controlled and are less frequent Takes propranolol 2 tabs every 8-12 hours as needed when she has migraine and lets deal works well for her Continue above-refill sent to pharmacy

## 2021-07-03 NOTE — Assessment & Plan Note (Signed)
Chronic Management per psychiatry For GAD-7 screening anxiety is well controlled And bupropion, BuSpar and intermittently will take propranolol if needed

## 2021-07-03 NOTE — Assessment & Plan Note (Signed)
Chronic Lipid panel Encouraged regular exercise, low-fat diet

## 2021-07-08 ENCOUNTER — Telehealth: Payer: Self-pay | Admitting: Internal Medicine

## 2021-07-08 NOTE — Telephone Encounter (Signed)
Pt. Has called and is requesting a fax to be sent about pain management, stating that lungs are cleared from [place]. Fax to Dr. Primus Bravo    Fax #- [phone]

## 2021-07-08 NOTE — Telephone Encounter (Signed)
Note faxed today and fax conformation received.

## 2021-07-22 ENCOUNTER — Encounter: Payer: Self-pay | Admitting: Internal Medicine

## 2021-07-22 ENCOUNTER — Ambulatory Visit (INDEPENDENT_AMBULATORY_CARE_PROVIDER_SITE_OTHER): Payer: Commercial Managed Care - PPO | Admitting: Internal Medicine

## 2021-07-22 ENCOUNTER — Other Ambulatory Visit: Payer: Self-pay

## 2021-07-22 DIAGNOSIS — M5416 Radiculopathy, lumbar region: Secondary | ICD-10-CM

## 2021-07-22 MED ORDER — METHYLPREDNISOLONE 4 MG PO TBPK: ORAL_TABLET | ORAL | 0 refills | Status: DC

## 2021-07-22 MED ORDER — KETOROLAC TROMETHAMINE 60 MG/2ML IM SOLN
60.0000 mg | Freq: Once | INTRAMUSCULAR | Status: AC
  Administered 2021-07-22: 60 mg via INTRAMUSCULAR

## 2021-07-22 NOTE — Assessment & Plan Note (Addendum)
R side Rest Medrol pack Toradol inj IM Norco prn - per [facility]

## 2021-07-22 NOTE — Progress Notes (Addendum)
Pls cosign for Toradol inj.Marland KitchenJohny Chess Medical screening examination/treatment/procedure(s) were performed by non-physician practitioner and as supervising physician I was immediately available for consultation/collaboration.  I agree with above. Lew Dawes, MD

## 2021-07-22 NOTE — Progress Notes (Signed)
Subjective:  Patient ID: Nicole Dunn, female    DOB: [birth year]  Age: 55 y.o. MRN: [id number]  CC: Back Pain ((R) side. Pt states pain goes down her buttock into her groin. ? If she pull something. She states she had been putting up [employer])   HPI MATILDE POTTENGER presents for severe R LBP irrad down R buttock, R thigh pain Pain started on Thursday after putting a Christmas tree up - severe. She is a RT. Worse w/standing, walking. Pain is 6/10 . Outpatient Medications Prior to Visit  Medication Sig Dispense Refill   baclofen (LIORESAL) 10 MG tablet Take 10 mg by mouth 2 (two) times daily.     buPROPion (WELLBUTRIN XL) 300 MG 24 hr tablet Take 1 tablet (300 mg total) by mouth daily. Must transition care to new provider for refills 90 tablet 0   busPIRone (BUSPAR) 10 MG tablet Take 10 mg by mouth 2 (two) times daily.     Cholecalciferol (VITAMIN D3) 5000 units CAPS Take 1 capsule by mouth daily.     diclofenac (FLECTOR) 1.3 % PTCH APPLY 1 PATCH TO EACH FOOT TWICE PER DAY FOR ACUTE PAIN WITH SEVERE INFLAMMATION AND SPASM     estradiol (ESTRACE) 1 MG tablet Take 1 mg by mouth daily.     gabapentin (NEURONTIN) 600 MG tablet      HYDROcodone-acetaminophen (NORCO) 10-325 MG tablet SMARTSIG:1 Tablet(s) By Mouth 4-5 Times Daily     Magnesium 400 MG CAPS Take 400 mg by mouth.     metroNIDAZOLE (METROCREAM) 0.75 % cream Apply 1 application topically 2 (two) times daily.     propranolol (INDERAL) 10 MG tablet TAKE 2 TABLETS EVERY 8 TO 12 HOURS AS NEEDED 90 tablet 2   traZODone (DESYREL) 50 MG tablet Take 50 mg by mouth at bedtime.     valACYclovir (VALTREX) 500 MG tablet Take 500 mg by mouth daily.     No facility-administered medications prior to visit.    ROS: Review of Systems  Constitutional:  Negative for activity change, appetite change, chills, fatigue and unexpected weight change.  HENT:  Negative for congestion, mouth sores and sinus pressure.   Eyes:  Negative for visual  disturbance.  Respiratory:  Negative for cough and chest tightness.   Gastrointestinal:  Negative for abdominal pain and nausea.  Genitourinary:  Negative for difficulty urinating, frequency and vaginal pain.  Musculoskeletal:  Positive for back pain and gait problem.  Skin:  Negative for pallor and rash.  Neurological:  Negative for dizziness, tremors, weakness, numbness and headaches.  Psychiatric/Behavioral:  Negative for confusion and sleep disturbance.    Objective:  BP 120/72 (BP Location: Left Arm)   Pulse 68   Temp 98.2 F (36.8 C) (Oral)   Ht 5' 3.5" (1.613 m)   Wt 144 lb 3.2 oz (65.4 kg)   SpO2 96%   BMI 25.14 kg/m   BP Readings from Last 3 Encounters:  07/22/21 120/72  07/03/21 118/82  01/01/21 131/88    Wt Readings from Last 3 Encounters:  07/22/21 144 lb 3.2 oz (65.4 kg)  07/03/21 145 lb (65.8 kg)  12/31/20 140 lb (63.5 kg)    Physical Exam Constitutional:      General: She is not in acute distress.    Appearance: She is well-developed.  HENT:     Head: Normocephalic.     Right Ear: External ear normal.     Left Ear: External ear normal.     Nose: Nose  normal.  Eyes:     General:        Right eye: No discharge.        Left eye: No discharge.     Conjunctiva/sclera: Conjunctivae normal.     Pupils: Pupils are equal, round, and reactive to light.  Neck:     Thyroid: No thyromegaly.     Vascular: No JVD.     Trachea: No tracheal deviation.  Cardiovascular:     Rate and Rhythm: Normal rate and regular rhythm.     Heart sounds: Normal heart sounds.  Pulmonary:     Effort: No respiratory distress.     Breath sounds: No stridor. No wheezing.  Abdominal:     General: Bowel sounds are normal. There is no distension.     Palpations: Abdomen is soft. There is no mass.     Tenderness: There is no abdominal tenderness. There is no guarding or rebound.  Musculoskeletal:        General: Tenderness present.     Cervical back: Normal range of motion and neck  supple. No rigidity.  Lymphadenopathy:     Cervical: No cervical adenopathy.  Skin:    Findings: No erythema or rash.  Neurological:     Cranial Nerves: No cranial nerve deficit.     Motor: No abnormal muscle tone.     Coordination: Coordination normal.     Gait: Gait abnormal.     Deep Tendon Reflexes: Reflexes normal.  Psychiatric:        Behavior: Behavior normal.        Thought Content: Thought content normal.        Judgment: Judgment normal.   Antalgic gait LS w/pain Str leg +/- on the R   Lab Results  Component Value Date   WBC 6.0 07/03/2021   HGB 11.7 (L) 07/03/2021   HCT 35.0 (L) 07/03/2021   PLT 233.0 07/03/2021   GLUCOSE 92 07/03/2021   CHOL 156 07/03/2021   TRIG 101.0 07/03/2021   HDL 64.50 07/03/2021   LDLDIRECT 66.0 04/07/2016   LDLCALC 71 07/03/2021   ALT 23 07/03/2021   AST 18 07/03/2021   NA 141 07/03/2021   K 3.9 07/03/2021   CL 107 07/03/2021   CREATININE 0.89 07/03/2021   BUN 14 07/03/2021   CO2 29 07/03/2021   TSH 2.08 07/03/2021   HGBA1C 4.8 05/03/2019    DG Toe 5th Right  Result Date: 01/01/2021 CLINICAL DATA:  Right fifth digit fracture, post reduction imaging EXAM: RIGHT FIFTH TOE COMPARISON:  None. FINDINGS: Three view radiograph of the right fifth digit demonstrates interval closed reduction of the proximal phalangeal fracture with fracture fragments in near anatomic alignment. Buddy taping of the fourth and fifth digits is noted. No other changes are seen. IMPRESSION: Interval closed reduction of right fifth proximal phalangeal fracture with fracture fragments in near anatomic alignment. Electronically Signed   By: Fidela Salisbury MD   On: 01/01/2021 04:18   DG Toe 5th Right  Result Date: 12/31/2020 CLINICAL DATA:  Left fifth toe injury EXAM: RIGHT FIFTH TOE COMPARISON:  None. FINDINGS: Three view radiograph left fifth toe demonstrates an acute, extra-articular, oblique fracture of the diaphysis of the right fifth proximal phalanx with  moderate lateral angulation of the distal fracture fragment. Joint spaces are preserved. No other fracture or dislocation. Moderate surrounding soft tissue swelling. IMPRESSION: Laterally angulated oblique diaphyseal extra-articular fracture of the proximal phalanx of the right fifth digit. Electronically Signed   By: Cassandria Anger  Christa See MD   On: 12/31/2020 22:58    Assessment & Plan:   Problem List Items Addressed This Visit     Lumbar radiculopathy    R side Rest Medrol pack Toradol inj IM         Meds ordered this encounter  Medications   methylPREDNISolone (MEDROL DOSEPAK) 4 MG TBPK tablet    Sig: As directed    Dispense:  21 tablet    Refill:  0       Follow-up: Return in about 2 weeks (around 08/05/2021) for a follow-up visit.  Walker Kehr, MD

## 2021-07-22 NOTE — Addendum Note (Signed)
Addended by: Earnstine Regal on: 07/22/2021 10:36 AM   Modules accepted: Orders

## 2021-07-23 ENCOUNTER — Ambulatory Visit: Payer: Commercial Managed Care - PPO | Admitting: Internal Medicine

## 2021-07-27 ENCOUNTER — Emergency Department (HOSPITAL_COMMUNITY)
Admission: EM | Admit: 2021-07-27 | Discharge: 2021-07-27 | Disposition: A | Discharge status: Home / Self Care | Payer: Commercial Managed Care - PPO | Attending: Emergency Medicine | Admitting: Emergency Medicine

## 2021-07-27 ENCOUNTER — Other Ambulatory Visit: Payer: Self-pay

## 2021-07-27 ENCOUNTER — Emergency Department (HOSPITAL_COMMUNITY): Payer: Commercial Managed Care - PPO

## 2021-07-27 DIAGNOSIS — Z8616 Personal history of COVID-19: Secondary | ICD-10-CM | POA: Insufficient documentation

## 2021-07-27 DIAGNOSIS — M545 Low back pain, unspecified: Secondary | ICD-10-CM | POA: Diagnosis not present

## 2021-07-27 DIAGNOSIS — Z87891 Personal history of nicotine dependence: Secondary | ICD-10-CM | POA: Insufficient documentation

## 2021-07-27 DIAGNOSIS — M62838 Other muscle spasm: Secondary | ICD-10-CM | POA: Insufficient documentation

## 2021-07-27 MED ORDER — HYDROMORPHONE HCL 2 MG/ML IJ SOLN
2.0000 mg | Freq: Once | INTRAMUSCULAR | Status: AC
Start: 2021-07-27 — End: 2021-07-27
  Administered 2021-07-27: 2 mg via INTRAMUSCULAR
  Filled 2021-07-27: qty 1

## 2021-07-27 NOTE — ED Provider Notes (Signed)
[facility] DEPT Provider Note   CSN: [id number] Arrival date & time: 07/27/21  1047     History Chief Complaint  Patient presents with   Back Pain    Nicole Dunn is a 55 y.o. female who presents with back pain. Onset 1 week ago after putting up xmas tree .  She complains of lower back pain worse on the right side.  She states that she was seen by her PCP and took a prednisone taper.  She states the pain has been worsening.  She has severe pain whenever she tries to bear weight that wraps around her hip into the groin and right gluteus.  She denies any numbness tingling weakness saddle anesthesia bowel or bladder incontinence.  She has taken her regular Norco and gabapentin without relief of her symptoms.   Back Pain Associated symptoms: no fever, no numbness and no weakness       Past Medical History:  Diagnosis Date   Anxiety    Common migraine    with prodrome visual auro, "black spot with blind peripheral ring"   Degenerative disc disease    with C6-7 encroachment & C3-4 narrowing   Degenerative disc disease, lumbar    L5-S1, Dr. Niel Hummer, Neurology   Endometrial hyperplasia 07/10/2011   Dr Lennie Muckle   GERD (gastroesophageal reflux disease)    Hypertriglyceridemia 2011   TG 176; HDL 58.9; LDL 121.7   Nonspecific elevation of levels of transaminase or lactic acid dehydrogenase (LDH) 2011    ALT 61    Patient Active Problem List   Diagnosis Date Noted   COVID-19 06/17/2021   Rosacea 05/29/2020   Tear of left acetabular labrum 05/29/2020   Torn rotator cuff 05/29/2020   Bilateral temporomandibular joint pain 04/19/2018   Cervical adenopathy 04/19/2018   Lumbar radiculopathy 10/02/2017   High triglycerides 05/27/2017   PVC (premature ventricular contraction) 07/29/2011   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 Paulla Dolly   MENISCUS REPAIR  2012   Dr  Noemi Chapel   otic tubes     bilaterally @age  4   TONSILLECTOMY     TOTAL ABDOMINAL HYSTERECTOMY  05/2011   Dr Nancy Marus, [facility]     OB History     Gravida  2   Para  2   Term      Preterm      AB      Living         SAB      IAB      Ectopic      Multiple      Live Births              Family History  Problem Relation Age of Onset   Hypertension Father    Deep vein thrombosis Father    Pulmonary embolism Father    Hypertension Mother    Kidney failure Mother        transitory/iatrogenic   Liver disease Mother        hepatic insufficiency   OCD Mother    Breast cancer Mother    Peripheral vascular disease Brother    Clotting  disorder Paternal Grandfather    Pulmonary embolism Paternal Grandfather    Bipolar disorder Other        sister & brother   Depression Other        sister & brother   Heart attack Maternal Grandmother        in 25s   Depression Other    Asthma Neg Hx    Diabetes Neg Hx    Stroke Neg Hx     Social History   Tobacco Use   Smoking status: Former    Packs/day: 2.86    Years: 12.00    Pack years: 34.32    Types: Cigarettes    Quit date: 08/11/2006    Years since quitting: 14.9   Smokeless tobacco: Never   Tobacco comments:    smoked 1983-2000, up to 1/2 ppd  Substance Use Topics   Alcohol use: Yes    Comment: rarely   Drug use: No    Home Medications Prior to Admission medications   Medication Sig Start Date End Date Taking? Authorizing Provider  baclofen (LIORESAL) 10 MG tablet Take 10 mg by mouth 2 (two) times daily. 07/11/19   [provider]  buPROPion (WELLBUTRIN XL) 300 MG 24 hr tablet Take 1 tablet (300 mg total) by mouth daily. Must transition care to new provider  for refills 08/31/15   Hendricks Limes, MD  busPIRone (BUSPAR) 10 MG tablet Take 10 mg by mouth 2 (two) times daily. 04/05/20   [provider]  Cholecalciferol (VITAMIN D3) 5000 units CAPS Take 1 capsule by mouth daily.    [provider]  diclofenac (FLECTOR) 1.3 % PTCH APPLY 1 PATCH TO EACH FOOT TWICE PER DAY FOR ACUTE PAIN WITH SEVERE INFLAMMATION AND SPASM 05/31/18   [provider]  estradiol (ESTRACE) 1 MG tablet Take 1 mg by mouth daily. 04/09/19   [provider]  gabapentin (NEURONTIN) 600 MG tablet  08/27/18   [provider]  HYDROcodone-acetaminophen (NORCO) 10-325 MG tablet SMARTSIG:1 Tablet(s) By Mouth 4-5 Times Daily 05/27/20   [provider]  Magnesium 400 MG CAPS Take 400 mg by mouth.    [provider]  methylPREDNISolone (MEDROL DOSEPAK) 4 MG TBPK tablet As directed 07/22/21   Plotnikov, Evie Lacks, MD  metroNIDAZOLE (METROCREAM) 0.75 % cream Apply 1 application topically 2 (two) times daily. 02/14/20   [provider]  propranolol (INDERAL) 10 MG tablet TAKE 2 TABLETS EVERY 8 TO 12 HOURS AS NEEDED 07/03/21   Binnie Rail, MD  traZODone (DESYREL) 50 MG tablet Take 50 mg by mouth at bedtime.    [provider]  valACYclovir (VALTREX) 500 MG tablet Take 500 mg by mouth daily. 03/17/19   [provider]    Allergies    Rocephin [ceftriaxone sodium in dextrose], Escitalopram oxalate, and Topiramate  Review of Systems   Review of Systems  Constitutional:  Negative for chills and fever.  Musculoskeletal:  Positive for back pain and gait problem.  Neurological:  Negative for weakness and numbness.   Physical Exam Updated Vital Signs BP (!) 147/91 (BP Location: Left Arm)    Pulse 96    Temp 98 F (36.7 C) (Oral)    Resp 18    Ht 5\' 3"  (1.6 m)    Wt 65.4 kg    SpO2 99%    BMI 25.54 kg/m   Physical Exam Vitals and nursing note reviewed.  Constitutional:      General: She is not in  acute  distress.    Appearance: She is well-developed. She is not diaphoretic.     Comments: tearful  HENT:     Head: Normocephalic and atraumatic.     Right Ear: External ear normal.     Left Ear: External ear normal.     Nose: Nose normal.     Mouth/Throat:     Mouth: Mucous membranes are moist.  Eyes:     General: No scleral icterus.    Conjunctiva/sclera: Conjunctivae normal.  Cardiovascular:     Rate and Rhythm: Normal rate and regular rhythm.     Heart sounds: Normal heart sounds. No murmur heard.   No friction rub. No gallop.  Pulmonary:     Effort: Pulmonary effort is normal. No respiratory distress.     Breath sounds: Normal breath sounds.  Abdominal:     General: Bowel sounds are normal. There is no distension.     Palpations: Abdomen is soft. There is no mass.     Tenderness: There is no abdominal tenderness. There is no guarding.  Musculoskeletal:     Cervical back: Normal range of motion.     Comments: No midline spinal tenderness. R lumbar paraspinal tenderness and spasm. Painful and reduced lumbar paraspinal tenderness. No leg weakness, +2 patellar reflex. - straight leg test.  Skin:    General: Skin is warm and dry.  Neurological:     Mental Status: She is alert and oriented to person, place, and time.  Psychiatric:        Behavior: Behavior normal.    ED Results / Procedures / Treatments   Labs (all labs ordered are listed, but only abnormal results are displayed) Labs Reviewed - No data to display  EKG None  Radiology No results found.  Procedures Procedures   Medications Ordered in ED Medications - No data to display  ED Course  I have reviewed the triage vital signs and the nursing notes.  Pertinent labs & imaging results that were available during my care of the patient were reviewed by me and considered in my medical decision making (see chart for details).    MDM Rules/Calculators/A&P                           Patient with back pain. I  ordered and reviewed images of the lumbar spine and sacrum and hip.  She has no acute findings.  I discussed degenerative changes and disc space loss with patient seen on lumbar spine film.  Patient is currently under pain contract with her PCP and I am unable to prescribe narcotics.  She also has baclofen and crutches.  At this point there is not much else I am able to do.  Patient will be discharged with Celebrex.  She may follow close with her primary care physician should she need further imaging in the outpatient setting.  No neurological deficits.  Patient can walk but states is painful.  No loss of bowel or bladder control.  No concern for cauda equina.  No fever, night sweats, weight loss, h/o cancer, IVDU.  RICE protocol and pain medicine indicated and discussed with patient.      Final Clinical Impression(s) / ED Diagnoses Final diagnoses:  None    Rx / DC Orders ED Discharge Orders     None        Margarita Mail, PA-C 07/27/21 1718    Valarie Merino, MD 07/29/21 [phone]

## 2021-07-27 NOTE — ED Triage Notes (Signed)
Patient reports injuring back 1 week ago while putting up christmas tree. Went to pcp, started on steroids, pain progressively worsening and patient unable to bear weight on right leg. Pain rated  9/10

## 2021-07-27 NOTE — ED Provider Notes (Signed)
Emergency Medicine Provider Triage Evaluation Note  Nicole Dunn , a 55 y.o. female  was evaluated in triage.  Pt complains of R back an hip pain. After injury putting up xmas tree 1 week ago.  She complains of lower back pain worse on the right side.  She states that she was seen by her PCP and took a prednisone taper.  She states the pain has been worsening.  She has severe pain whenever she tries to bear weight that wraps around her hip into the groin and right gluteus.  She denies any numbness tingling weakness saddle anesthesia bowel or bladder incontinence.  She has taken her regular Norco and gabapentin without relief of her symptoms.  Review of Systems  Positive: Back pain  Negative: weakness  Physical Exam  BP (!) 147/91 (BP Location: Left Arm)    Pulse 96    Temp 98 F (36.7 C) (Oral)    Resp 18    Ht 5\' 3"  (1.6 m)    Wt 65.4 kg    SpO2 99%    BMI 25.54 kg/m  Gen:   Awake, no distress   Resp:  Normal effort  MSK:   Moves extremities without difficulty  Other:  - straight  leg test negative + spasm R lumbar paraspinals  Medical Decision Making  Medically screening exam initiated at 11:11 AM.  Appropriate orders placed.  Merlene Pulling was informed that the remainder of the evaluation will be completed by another provider, this initial triage assessment does not replace that evaluation, and the importance of remaining in the ED until their evaluation is complete.     Margarita Mail, PA-C 07/27/21 1117    Valarie Merino, MD 07/29/21 [phone]

## 2021-07-29 ENCOUNTER — Telehealth: Payer: Self-pay | Admitting: Internal Medicine

## 2021-07-29 NOTE — Telephone Encounter (Signed)
Patient requesting an ed f/u appt for today  Informed patient there are no ov available for today  Reviewed patient's ed avs, avs states patient is to follow-up w/ provider, no specific date was recommended  Offered patient provider's next availability, patient declined stating she will go back to ed  Please advise

## 2021-07-29 NOTE — Telephone Encounter (Signed)
Spoke with patient today.  She will hold off until her appointment on Wednesday.

## 2021-07-29 NOTE — Telephone Encounter (Signed)
She is already on hydrocodone at a good dose a few times a day-how often how often is she taking this?  She cannot take 2 narcotics at the same time.  We can increase the gabapentin to see if that helps.  We can try another round of steroids.

## 2021-07-29 NOTE — Telephone Encounter (Signed)
Patient requesting a call back   *see below*

## 2021-10-15 IMAGING — DX DG TOE 5TH 2+V*R*
3 series · 3 of 3 positions shown · non-contrast
Comparison: None.

CLINICAL DATA: Right fifth digit fracture, post reduction imaging

EXAM:
RIGHT FIFTH TOE

[toe ap]
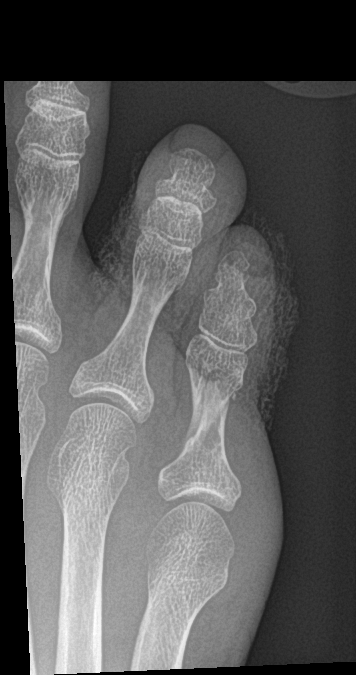

[toe obl]
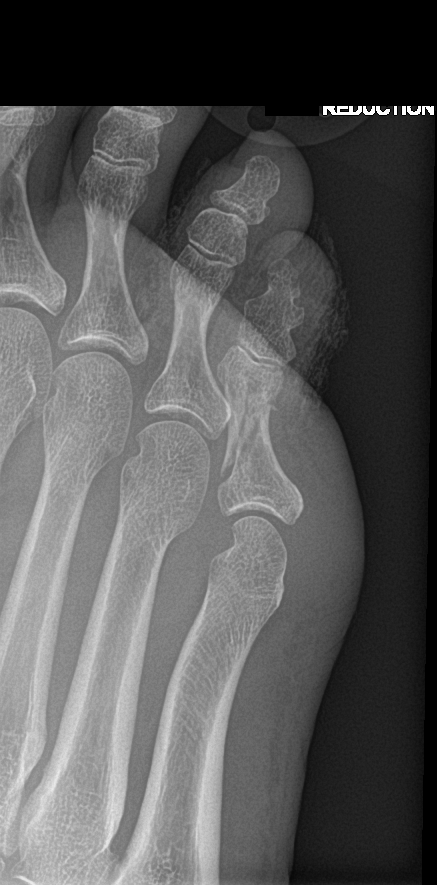

[toe lat]
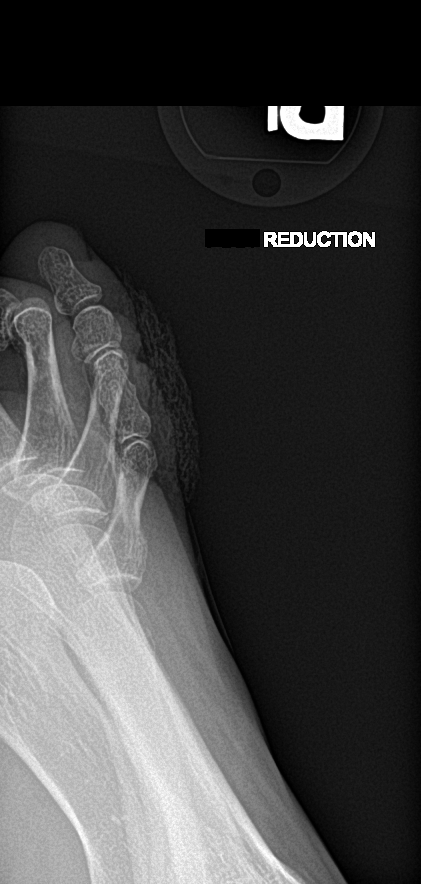

[3 of 3 positions shown; findings below may reference images not displayed]

FINDINGS: Three view radiograph of the right fifth digit demonstrates interval
closed reduction of the proximal phalangeal fracture with fracture
fragments in near anatomic alignment. Buddy taping of the fourth and
fifth digits is noted. No other changes are seen.
IMPRESSION: Interval closed reduction of right fifth proximal phalangeal
fracture with fracture fragments in near anatomic alignment.

## 2021-11-09 ENCOUNTER — Other Ambulatory Visit: Payer: Self-pay | Admitting: Internal Medicine

## 2021-11-22 ENCOUNTER — Other Ambulatory Visit: Payer: Self-pay | Admitting: Internal Medicine

## 2021-12-17 ENCOUNTER — Other Ambulatory Visit: Payer: Self-pay | Admitting: Internal Medicine

## 2022-06-13 ENCOUNTER — Other Ambulatory Visit: Payer: Self-pay | Admitting: Internal Medicine

## 2022-08-22 ENCOUNTER — Encounter: Payer: Self-pay | Admitting: Podiatry

## 2022-08-22 ENCOUNTER — Ambulatory Visit (INDEPENDENT_AMBULATORY_CARE_PROVIDER_SITE_OTHER): Payer: Commercial Managed Care - PPO

## 2022-08-22 ENCOUNTER — Ambulatory Visit (INDEPENDENT_AMBULATORY_CARE_PROVIDER_SITE_OTHER): Payer: Commercial Managed Care - PPO | Admitting: Podiatry

## 2022-08-22 DIAGNOSIS — S92515A Nondisplaced fracture of proximal phalanx of left lesser toe(s), initial encounter for closed fracture: Secondary | ICD-10-CM

## 2022-08-22 DIAGNOSIS — M7672 Peroneal tendinitis, left leg: Secondary | ICD-10-CM | POA: Diagnosis not present

## 2022-08-22 DIAGNOSIS — M79672 Pain in left foot: Secondary | ICD-10-CM

## 2022-08-22 MED ORDER — TRIAMCINOLONE ACETONIDE 10 MG/ML IJ SUSP
10.0000 mg | Freq: Once | INTRAMUSCULAR | Status: AC
  Administered 2022-08-22: 10 mg

## 2022-08-26 ENCOUNTER — Other Ambulatory Visit: Payer: Self-pay | Admitting: Podiatry

## 2022-08-26 DIAGNOSIS — S92515A Nondisplaced fracture of proximal phalanx of left lesser toe(s), initial encounter for closed fracture: Secondary | ICD-10-CM

## 2022-08-26 DIAGNOSIS — M79672 Pain in left foot: Secondary | ICD-10-CM

## 2022-08-26 DIAGNOSIS — M7672 Peroneal tendinitis, left leg: Secondary | ICD-10-CM

## 2022-08-27 NOTE — Progress Notes (Signed)
Subjective:   Patient ID: Nicole Dunn, female   DOB: 57 y.o.   MRN: [id number]   HPI Patient states she developed a lot of pain on the outside of her left foot recently and it is hard to walk with that she does not remember specific injury   ROS      Objective:  Physical Exam  Neuro vascular status intact with inflammation pain around the fifth metatarsal base left with no indications currently of muscle dysfunction or loss of strength     Assessment:  Probability for peroneal tendinitis left cannot rule out bony injury or other pathology     Plan:  H&P x-ray reviewed sterile prep injected the sheath left 3 mg Dexasone Kenalog 5 mg Xylocaine advised on ice therapy and reduced activity.  Reappoint as symptoms indicate  X-rays indicate that there is mild reactivity no indication fracture or bony pathology

## 2022-09-08 ENCOUNTER — Encounter: Payer: Self-pay | Admitting: Internal Medicine

## 2022-09-15 ENCOUNTER — Encounter: Payer: Self-pay | Admitting: Internal Medicine

## 2022-09-15 NOTE — Patient Instructions (Addendum)
Blood work was ordered.   The lab is on the first floor.    Medications changes include :   none.   Try to taper off the omeprazole     Return in about 1 year (around 09/17/2023) for Physical Exam.    Health Maintenance, Female Adopting a healthy lifestyle and getting preventive care are important in promoting health and wellness. Ask your health care provider about: The right schedule for you to have regular tests and exams. Things you can do on your own to prevent diseases and keep yourself healthy. What should I know about diet, weight, and exercise? Eat a healthy diet  Eat a diet that includes plenty of vegetables, fruits, low-fat dairy products, and lean protein. Do not eat a lot of foods that are high in solid fats, added sugars, or sodium. Maintain a healthy weight Body mass index (BMI) is used to identify weight problems. It estimates body fat based on height and weight. Your health care provider can help determine your BMI and help you achieve or maintain a healthy weight. Get regular exercise Get regular exercise. This is one of the most important things you can do for your health. Most adults should: Exercise for at least 150 minutes each week. The exercise should increase your heart rate and make you sweat (moderate-intensity exercise). Do strengthening exercises at least twice a week. This is in addition to the moderate-intensity exercise. Spend less time sitting. Even light physical activity can be beneficial. Watch cholesterol and blood lipids Have your blood tested for lipids and cholesterol at 57 years of age, then have this test every 5 years. Have your cholesterol levels checked more often if: Your lipid or cholesterol levels are high. You are older than 57 years of age. You are at high risk for heart disease. What should I know about cancer screening? Depending on your health history and family history, you may need to have cancer screening at various  ages. This may include screening for: Breast cancer. Cervical cancer. Colorectal cancer. Skin cancer. Lung cancer. What should I know about heart disease, diabetes, and high blood pressure? Blood pressure and heart disease High blood pressure causes heart disease and increases the risk of stroke. This is more likely to develop in people who have high blood pressure readings or are overweight. Have your blood pressure checked: Every 3-5 years if you are 28-48 years of age. Every year if you are 22 years old or older. Diabetes Have regular diabetes screenings. This checks your fasting blood sugar level. Have the screening done: Once every three years after age 90 if you are at a normal weight and have a low risk for diabetes. More often and at a younger age if you are overweight or have a high risk for diabetes. What should I know about preventing infection? Hepatitis B If you have a higher risk for hepatitis B, you should be screened for this virus. Talk with your health care provider to find out if you are at risk for hepatitis B infection. Hepatitis C Testing is recommended for: Everyone born from [birth year] through 1965. Anyone with known risk factors for hepatitis C. Sexually transmitted infections (STIs) Get screened for STIs, including gonorrhea and chlamydia, if: You are sexually active and are younger than 57 years of age. You are older than 57 years of age and your health care provider tells you that you are at risk for this type of infection. Your sexual activity has changed  since you were last screened, and you are at increased risk for chlamydia or gonorrhea. Ask your health care provider if you are at risk. Ask your health care provider about whether you are at high risk for HIV. Your health care provider may recommend a prescription medicine to help prevent HIV infection. If you choose to take medicine to prevent HIV, you should first get tested for HIV. You should then be tested  every 3 months for as long as you are taking the medicine. Pregnancy If you are about to stop having your period (premenopausal) and you may become pregnant, seek counseling before you get pregnant. Take 400 to 800 micrograms (mcg) of folic acid every day if you become pregnant. Ask for birth control (contraception) if you want to prevent pregnancy. Osteoporosis and menopause Osteoporosis is a disease in which the bones lose minerals and strength with aging. This can result in bone fractures. If you are 35 years old or older, or if you are at risk for osteoporosis and fractures, ask your health care provider if you should: Be screened for bone loss. Take a calcium or vitamin D supplement to lower your risk of fractures. Be given hormone replacement therapy (HRT) to treat symptoms of menopause. Follow these instructions at home: Alcohol use Do not drink alcohol if: Your health care provider tells you not to drink. You are pregnant, may be pregnant, or are planning to become pregnant. If you drink alcohol: Limit how much you have to: 0-1 drink a day. Know how much alcohol is in your drink. In the [place] one drink equals one 12 oz bottle of beer (355 mL), one 5 oz glass of wine (148 mL), or one 1 oz glass of hard liquor (44 mL). Lifestyle Do not use any products that contain nicotine or tobacco. These products include cigarettes, chewing tobacco, and vaping devices, such as e-cigarettes. If you need help quitting, ask your health care provider. Do not use street drugs. Do not share needles. Ask your health care provider for help if you need support or information about quitting drugs. General instructions Schedule regular health, dental, and eye exams. Stay current with your vaccines. Tell your health care provider if: You often feel depressed. You have ever been abused or do not feel safe at home. Summary Adopting a healthy lifestyle and getting preventive care are important in promoting  health and wellness. Follow your health care provider's instructions about healthy diet, exercising, and getting tested or screened for diseases. Follow your health care provider's instructions on monitoring your cholesterol and blood pressure. This information is not intended to replace advice given to you by your health care provider. Make sure you discuss any questions you have with your health care provider. Document Revised: 12/17/2020 Document Reviewed: 12/17/2020 Elsevier Patient Education  [place].

## 2022-09-15 NOTE — Progress Notes (Unsigned)
Subjective:    Patient ID: Nicole Dunn, female    DOB: Aug 15, 1965, 57 y.o.   MRN: [id number]      HPI Nicole Dunn is here for a Physical exam.   Migraines have got more difficult to control. Propranolol not as effective.    Medications and allergies reviewed with patient and updated if appropriate.  Current Outpatient Medications on File Prior to Visit  Medication Sig Dispense Refill   atomoxetine (STRATTERA) 18 MG capsule Take 18 mg by mouth every morning.     baclofen (LIORESAL) 10 MG tablet Take 10 mg by mouth 2 (two) times daily.     buPROPion (WELLBUTRIN XL) 300 MG 24 hr tablet Take 1 tablet (300 mg total) by mouth daily. Must transition care to new provider for refills 90 tablet 0   busPIRone (BUSPAR) 10 MG tablet Take 10 mg by mouth 2 (two) times daily.     Cholecalciferol (VITAMIN D3) 5000 units CAPS Take 1 capsule by mouth daily.     diclofenac (FLECTOR) 1.3 % PTCH APPLY 1 PATCH TO EACH FOOT TWICE PER DAY FOR ACUTE PAIN WITH SEVERE INFLAMMATION AND SPASM     estradiol (ESTRACE) 1 MG tablet Take 1 mg by mouth daily.     gabapentin (NEURONTIN) 300 MG capsule 1 capsule Orally Four times a day for 90 days     HYDROcodone-acetaminophen (NORCO) 10-325 MG tablet SMARTSIG:1 Tablet(s) By Mouth 4-5 Times Daily     Magnesium 400 MG CAPS Take 400 mg by mouth.     metroNIDAZOLE (METROCREAM) 0.75 % cream Apply 1 application topically 2 (two) times daily.     propranolol (INDERAL) 10 MG tablet TAKE 2 TABLETS EVERY 8 TO 12 HOURS AS NEEDED. 270 tablet 1   traZODone (DESYREL) 50 MG tablet Take 50 mg by mouth at bedtime.     valACYclovir (VALTREX) 500 MG tablet Take 500 mg by mouth daily.     No current facility-administered medications on file prior to visit.    Review of Systems  Constitutional:  Negative for fever.  Eyes:  Negative for visual disturbance.  Respiratory:  Negative for cough, shortness of breath and wheezing.   Cardiovascular:  Negative for chest pain, palpitations and  leg swelling.  Gastrointestinal:  Positive for constipation (controlled with bowel regimen). Negative for abdominal pain, blood in stool and diarrhea.       Nicole Dunn - controlled  Genitourinary:  Negative for dysuria.  Musculoskeletal:  Positive for arthralgias (soft tissue - tears), back pain and neck pain.  Skin:  Negative for rash.  Neurological:  Positive for headaches. Negative for light-headedness.  Psychiatric/Behavioral:  Positive for dysphoric mood. The patient is nervous/anxious.        Objective:   Vitals:   09/16/22 0805  BP: 124/70  Pulse: 74  Temp: 98 F (36.7 C)  SpO2: 98%   Filed Weights   09/16/22 0805  Weight: 141 lb (64 kg)   Body mass index is 24.98 kg/m.  BP Readings from Last 3 Encounters:  09/16/22 124/70  07/27/21 (!) 147/91  07/22/21 120/72    Wt Readings from Last 3 Encounters:  09/16/22 141 lb (64 kg)  07/27/21 144 lb 3.2 oz (65.4 kg)  07/22/21 144 lb 3.2 oz (65.4 kg)       Physical Exam Constitutional: She appears well-developed and well-nourished. No distress.  HENT:  Head: Normocephalic and atraumatic.  Right Ear: External ear normal. Normal ear canal and TM Left Ear: External ear normal.  Normal ear canal and TM Mouth/Throat: Oropharynx is clear and moist.  Eyes: Conjunctivae normal.  Neck: Neck supple. No tracheal deviation present. No thyromegaly present.  No carotid bruit  Cardiovascular: Normal rate, regular rhythm and normal heart sounds.   No murmur heard.  No edema. Pulmonary/Chest: Effort normal and breath sounds normal. No respiratory distress. She has no wheezes. She has no rales.  Breast: deferred   Abdominal: Soft. She exhibits no distension. There is no tenderness.  Lymphadenopathy: She has no cervical adenopathy.  Skin: Skin is warm and dry. She is not diaphoretic.  Psychiatric: She has a normal mood and affect. Her behavior is normal.     Lab Results  Component Value Date   WBC 6.0 07/03/2021   HGB 11.7 (L)  07/03/2021   HCT 35.0 (L) 07/03/2021   PLT 233.0 07/03/2021   GLUCOSE 92 07/03/2021   CHOL 156 07/03/2021   TRIG 101.0 07/03/2021   HDL 64.50 07/03/2021   LDLDIRECT 66.0 04/07/2016   LDLCALC 71 07/03/2021   ALT 23 07/03/2021   AST 18 07/03/2021   NA 141 07/03/2021   K 3.9 07/03/2021   CL 107 07/03/2021   CREATININE 0.89 07/03/2021   BUN 14 07/03/2021   CO2 29 07/03/2021   TSH 2.08 07/03/2021   HGBA1C 4.8 05/03/2019         Assessment & Plan:   Physical exam: Screening blood work  ordered Exercise  sky [place] once a week, active at work Weight  normal Substance abuse  none   Reviewed recommended immunizations.   Health Maintenance  Topic Date Due   MAMMOGRAM  05/30/2021   PAP SMEAR-Modifier  03/16/2022   COVID-19 Vaccine (3 - 2023-24 season) 10/02/2022 (Originally 04/11/2022)   DTaP/Tdap/Td (3 - Td or Tdap) 05/02/2029   COLONOSCOPY (Pts 45-82yr Insurance coverage will need to be confirmed)  05/29/2029   INFLUENZA VACCINE  Completed   Hepatitis C Screening  Completed   HIV Screening  Completed   Zoster Vaccines- Shingrix  Completed   HPV VACCINES  Aged Out          See Problem List for Assessment and Plan of chronic medical problems.

## 2022-09-16 ENCOUNTER — Ambulatory Visit (INDEPENDENT_AMBULATORY_CARE_PROVIDER_SITE_OTHER): Payer: Commercial Managed Care - PPO | Admitting: Internal Medicine

## 2022-09-16 VITALS — BP 124/70 | HR 74 | Temp 98.0°F | Ht 63.0 in | Wt 141.0 lb

## 2022-09-16 DIAGNOSIS — K219 Gastro-esophageal reflux disease without esophagitis: Secondary | ICD-10-CM

## 2022-09-16 DIAGNOSIS — G43009 Migraine without aura, not intractable, without status migrainosus: Secondary | ICD-10-CM | POA: Diagnosis not present

## 2022-09-16 DIAGNOSIS — F411 Generalized anxiety disorder: Secondary | ICD-10-CM

## 2022-09-16 DIAGNOSIS — M5412 Radiculopathy, cervical region: Secondary | ICD-10-CM

## 2022-09-16 DIAGNOSIS — F988 Other specified behavioral and emotional disorders with onset usually occurring in childhood and adolescence: Secondary | ICD-10-CM | POA: Insufficient documentation

## 2022-09-16 DIAGNOSIS — M5416 Radiculopathy, lumbar region: Secondary | ICD-10-CM | POA: Diagnosis not present

## 2022-09-16 DIAGNOSIS — E781 Pure hyperglyceridemia: Secondary | ICD-10-CM

## 2022-09-16 DIAGNOSIS — Z0000 Encounter for general adult medical examination without abnormal findings: Secondary | ICD-10-CM

## 2022-09-16 LAB — COMPREHENSIVE METABOLIC PANEL
ALT: 41 U/L — ABNORMAL HIGH (ref 0–35)
AST: 37 U/L (ref 0–37)
Albumin: 4.3 g/dL (ref 3.5–5.2)
Alkaline Phosphatase: 52 U/L (ref 39–117)
BUN: 16 mg/dL (ref 6–23)
CO2: 26 mEq/L (ref 19–32)
Calcium: 9.3 mg/dL (ref 8.4–10.5)
Chloride: 103 mEq/L (ref 96–112)
Creatinine, Ser: 0.96 mg/dL (ref 0.40–1.20)
GFR: 66.25 mL/min (ref >60.00)
Glucose, Bld: 87 mg/dL (ref 70–99)
Potassium: 4 mEq/L (ref 3.5–5.1)
Sodium: 141 mEq/L (ref 135–145)
Total Bilirubin: 0.3 mg/dL (ref 0.2–1.2)
Total Protein: 6.8 g/dL (ref 6.0–8.3)

## 2022-09-16 LAB — CBC WITH DIFFERENTIAL/PLATELET
Basophils Absolute: 0 10*3/uL (ref 0.0–0.1)
Basophils Relative: 0.6 % (ref 0.0–3.0)
Eosinophils Absolute: 0.1 10*3/uL (ref 0.0–0.7)
Eosinophils Relative: 1.5 % (ref 0.0–5.0)
HCT: 36.8 % (ref 36.0–46.0)
Hemoglobin: 12.4 g/dL (ref 12.0–15.0)
Lymphocytes Relative: 29.3 % (ref 12.0–46.0)
Lymphs Abs: 1.2 10*3/uL (ref 0.7–4.0)
MCHC: 33.7 g/dL (ref 30.0–36.0)
MCV: 94.8 fl (ref 78.0–100.0)
Monocytes Absolute: 0.3 10*3/uL (ref 0.1–1.0)
Monocytes Relative: 6.2 % (ref 3.0–12.0)
Neutro Abs: 2.6 10*3/uL (ref 1.4–7.7)
Neutrophils Relative %: 62.4 % (ref 43.0–77.0)
Platelets: 178 10*3/uL (ref 150.0–400.0)
RBC: 3.89 Mil/uL (ref 3.87–5.11)
RDW: 12 % (ref 11.5–15.5)
WBC: 4.2 10*3/uL (ref 4.0–10.5)

## 2022-09-16 LAB — LIPID PANEL
Cholesterol: 145 mg/dL (ref 0–200)
HDL: 58.6 mg/dL (ref >39.00)
LDL Cholesterol: 64 mg/dL (ref 0–99)
NonHDL: 86.07
Total CHOL/HDL Ratio: 2
Triglycerides: 111 mg/dL (ref 0.0–149.0)
VLDL: 22.2 mg/dL (ref 0.0–40.0)

## 2022-09-16 LAB — TSH: TSH: 3.35 u[IU]/mL (ref 0.35–5.50)

## 2022-09-16 MED ORDER — RIZATRIPTAN BENZOATE 5 MG PO TABS: 5.0000 mg | ORAL_TABLET | ORAL | 0 refills | Status: DC | PRN

## 2022-09-16 NOTE — Assessment & Plan Note (Addendum)
Chronic Following and management per psychiatry On bupropion, buspirone, trazodone Check CMP, TSH

## 2022-09-16 NOTE — Assessment & Plan Note (Signed)
New diagnosis Managed by psych

## 2022-09-16 NOTE — Assessment & Plan Note (Signed)
Chronic GERD controlled Continue omeprazole otc daily - discussed risk - try tapering

## 2022-09-16 NOTE — Assessment & Plan Note (Signed)
Chronic Following with pain management On hydrocodone-acetaminophen, gabapentin

## 2022-09-16 NOTE — Assessment & Plan Note (Addendum)
Chronic Has migraines w and wo aura Migraines 1-2 times a month Continue propranolol 2 tabs every 8-12 hours as needed for migraine Propranolol has not worked as well recently Can use excedrin migraine prn - not to take more than 2/week Trial of maxalt 5 mg  - ? Side effect to this in the past - willing to retry - if she has a side effect will try nurtec or ubrelvy

## 2022-09-16 NOTE — Assessment & Plan Note (Signed)
Chronic Regular exercise and healthy diet encouraged Check lipid panel, CMP Continue lifestyle control 

## 2022-09-16 NOTE — Assessment & Plan Note (Signed)
Chronic Following with the pain clinic

## 2022-11-04 ENCOUNTER — Encounter (INDEPENDENT_AMBULATORY_CARE_PROVIDER_SITE_OTHER): Payer: Commercial Managed Care - PPO | Admitting: Internal Medicine

## 2022-11-04 DIAGNOSIS — G43009 Migraine without aura, not intractable, without status migrainosus: Secondary | ICD-10-CM

## 2022-11-04 MED ORDER — ZOLMITRIPTAN 5 MG PO TABS: 5.0000 mg | ORAL_TABLET | ORAL | 0 refills | Status: DC | PRN

## 2022-11-04 NOTE — Telephone Encounter (Signed)

## 2022-11-05 ENCOUNTER — Other Ambulatory Visit: Payer: Self-pay

## 2022-11-10 ENCOUNTER — Other Ambulatory Visit: Payer: Self-pay

## 2022-11-10 MED ORDER — RIZATRIPTAN BENZOATE 5 MG PO TABS: 5.0000 mg | ORAL_TABLET | ORAL | 0 refills | Status: DC | PRN

## 2022-12-14 ENCOUNTER — Encounter: Payer: Self-pay | Admitting: Internal Medicine

## 2022-12-15 MED ORDER — RIZATRIPTAN BENZOATE 10 MG PO TBDP: 10.0000 mg | ORAL_TABLET | ORAL | 11 refills | Status: DC | PRN

## 2022-12-15 NOTE — Addendum Note (Signed)
Addended by: Pincus Sanes on: 12/15/2022 01:10 PM   Modules accepted: Orders

## 2023-02-11 ENCOUNTER — Encounter: Payer: Self-pay | Admitting: Podiatry

## 2023-02-11 ENCOUNTER — Ambulatory Visit (INDEPENDENT_AMBULATORY_CARE_PROVIDER_SITE_OTHER): Payer: Commercial Managed Care - PPO | Admitting: Podiatry

## 2023-02-11 ENCOUNTER — Ambulatory Visit: Payer: Commercial Managed Care - PPO | Admitting: Podiatry

## 2023-02-11 DIAGNOSIS — M7672 Peroneal tendinitis, left leg: Secondary | ICD-10-CM

## 2023-02-11 MED ORDER — TRIAMCINOLONE ACETONIDE 10 MG/ML IJ SUSP
10.0000 mg | Freq: Once | INTRAMUSCULAR | Status: AC
  Administered 2023-02-11: 10 mg

## 2023-02-11 MED ORDER — DICLOFENAC SODIUM 75 MG PO TBEC: 75.0000 mg | DELAYED_RELEASE_TABLET | Freq: Two times a day (BID) | ORAL | 2 refills | Status: DC

## 2023-02-16 NOTE — Progress Notes (Signed)
Subjective:   Patient ID: Nicole Dunn, female   DOB: 58 y.o.   MRN: [id number]   HPI Patient states she is having pain in the outside of her left foot that is very sore and walking differently   ROS      Objective:  Physical Exam  Neurovascular status intact pain in the outside left foot around the base of the fifth metatarsal with fluid buildup     Assessment:  Acute peroneal tendinitis left     Plan:  Discussed inflammation and risk of injection she wants to have done understanding risk sterile prep injected the sheath left 3 mg Dexasone Kenalog 5 mg Xylocaine advised this patient on boot usage that she has at home and if symptoms persist will be seen back

## 2023-05-02 ENCOUNTER — Other Ambulatory Visit: Payer: Self-pay | Admitting: Podiatry

## 2023-07-02 ENCOUNTER — Other Ambulatory Visit: Payer: Self-pay | Admitting: Internal Medicine

## 2023-08-13 ENCOUNTER — Ambulatory Visit: Payer: Commercial Managed Care - PPO | Admitting: Podiatry

## 2023-08-21 ENCOUNTER — Encounter: Payer: Self-pay | Admitting: Podiatry

## 2023-08-21 ENCOUNTER — Ambulatory Visit (INDEPENDENT_AMBULATORY_CARE_PROVIDER_SITE_OTHER): Payer: Commercial Managed Care - PPO | Admitting: Podiatry

## 2023-08-21 ENCOUNTER — Ambulatory Visit (INDEPENDENT_AMBULATORY_CARE_PROVIDER_SITE_OTHER): Payer: Commercial Managed Care - PPO

## 2023-08-21 DIAGNOSIS — M79672 Pain in left foot: Secondary | ICD-10-CM

## 2023-08-21 DIAGNOSIS — M7672 Peroneal tendinitis, left leg: Secondary | ICD-10-CM

## 2023-08-21 MED ORDER — TRIAMCINOLONE ACETONIDE 10 MG/ML IJ SUSP
10.0000 mg | Freq: Once | INTRAMUSCULAR | Status: AC
  Administered 2023-08-21: 10 mg via INTRA_ARTICULAR

## 2023-08-24 NOTE — Progress Notes (Signed)
 Subjective:   Patient ID: Nicole Dunn, female   DOB: 58 y.o.   MRN: [id number]   HPI Patient states the outside of her left foot has started to hurt again it has been 6 months   ROS      Objective:  Physical Exam  Neuro vascular status intact inflammation pain lateral side left foot base of fifth metatarsal fluid buildup around the area that did well for a number of months and has reoccurred     Assessment:  Inflammatory tendinitis base of fifth metatarsal left with possibility for hypertrophic bone structure     Plan:  H&P reviewed at this point did do a careful sterile injection after explaining risk and if symptoms come back quick we will get an MRI may eventually need bony work and possible tendon repositioning.  Patient understands is not interested in surgery current time  X-rays do indicate mild prominence around the area but no changes from previous x-ray

## 2023-09-18 ENCOUNTER — Ambulatory Visit (INDEPENDENT_AMBULATORY_CARE_PROVIDER_SITE_OTHER): Payer: Commercial Managed Care - PPO | Admitting: Podiatry

## 2023-09-18 ENCOUNTER — Encounter: Payer: Self-pay | Admitting: Podiatry

## 2023-09-18 DIAGNOSIS — M722 Plantar fascial fibromatosis: Secondary | ICD-10-CM | POA: Diagnosis not present

## 2023-09-18 MED ORDER — TRIAMCINOLONE ACETONIDE 10 MG/ML IJ SUSP: 10.0000 mg | Freq: Once | INTRAMUSCULAR | Status: AC

## 2023-09-21 NOTE — Progress Notes (Signed)
 Subjective:   Patient ID: Nicole Dunn, female   DOB: 58 y.o.   MRN: [id number]   HPI Patient states the pain has gone back into the heel again and that the outside right now is feeling pretty good   ROS      Objective:  Physical Exam  Neurovascular status intact with inflammation plantar heel left fluid buildup pain with pain on the outside that is improved     Assessment:  Acute plantar fasciitis left with improved peroneal tendinitis left     Plan:  H&P reviewed and I went ahead today did sterile prep injected the plantar fascia 3 mg dexamethasone Kenalog  5 mg Xylocaine  applied instructions for stretching physical therapy reappoint as symptoms indicate

## 2023-10-13 ENCOUNTER — Encounter: Payer: Commercial Managed Care - PPO | Admitting: Internal Medicine

## 2023-10-26 ENCOUNTER — Encounter: Payer: Self-pay | Admitting: Internal Medicine

## 2023-10-26 NOTE — Progress Notes (Signed)
 Subjective:    Patient ID: Nicole Dunn, female    DOB: 1965-11-06, 58 y.o.   MRN: [id number]      HPI Nicole Dunn is here for a Physical exam and her chronic medical problems.    Last visit - trial of maxalt   Medications and allergies reviewed with patient and updated if appropriate.  Current Outpatient Medications on File Prior to Visit  Medication Sig Dispense Refill  . atomoxetine (STRATTERA) 18 MG capsule Take 18 mg by mouth every morning.    . baclofen (LIORESAL) 10 MG tablet Take 10 mg by mouth 2 (two) times daily.    . buPROPion  (WELLBUTRIN  XL) 300 MG 24 hr tablet Take 1 tablet (300 mg total) by mouth daily. Must transition care to new provider for refills 90 tablet 0  . Cholecalciferol (VITAMIN D3) 5000 units CAPS Take 1 capsule by mouth daily.    . diclofenac  (FLECTOR) 1.3 % PTCH APPLY 1 PATCH TO EACH FOOT TWICE PER DAY FOR ACUTE PAIN WITH SEVERE INFLAMMATION AND SPASM    . estradiol (ESTRACE) 1 MG tablet Take 1 mg by mouth daily.    . gabapentin  (NEURONTIN ) 300 MG capsule 1 capsule Orally Four times a day for 90 days    . HYDROcodone -acetaminophen  (NORCO) 10-325 MG tablet SMARTSIG:1 Tablet(s) By Mouth 4-5 Times Daily    . Magnesium 400 MG CAPS Take 400 mg by mouth.    . metroNIDAZOLE (METROCREAM) 0.75 % cream Apply 1 application topically 2 (two) times daily.    . propranolol  (INDERAL ) 10 MG tablet TAKE 2 TABLETS EVERY 8 TO 12 HOURS AS NEEDED. 270 tablet 1  . traZODone (DESYREL) 50 MG tablet Take 50 mg by mouth at bedtime.    . valACYclovir (VALTREX) 500 MG tablet Take 500 mg by mouth daily.     Current Facility-Administered Medications on File Prior to Visit  Medication Dose Route Frequency Provider Last Rate Last Admin  . triamcinolone  acetonide (KENALOG ) 10 MG/ML injection 10 mg  10 mg Intra-articular Once         Review of Systems     Objective:  There were no vitals filed for this visit. There were no vitals filed for this visit. There is no height or weight on  file to calculate BMI.  BP Readings from Last 3 Encounters:  12/02/23 120/80  09/16/22 124/70  07/27/21 (!) 147/91    Wt Readings from Last 3 Encounters:  12/02/23 144 lb (65.3 kg)  09/16/22 141 lb (64 kg)  07/27/21 144 lb 3.2 oz (65.4 kg)       Physical Exam Constitutional: She appears well-developed and well-nourished. No distress.  HENT:  Head: Normocephalic and atraumatic.  Right Ear: External ear normal. Normal ear canal and TM Left Ear: External ear normal.  Normal ear canal and TM Mouth/Throat: Oropharynx is clear and moist.  Eyes: Conjunctivae normal.  Neck: Neck supple. No tracheal deviation present. No thyromegaly present.  No carotid bruit  Cardiovascular: Normal rate, regular rhythm and normal heart sounds.   No murmur heard.  No edema. Pulmonary/Chest: Effort normal and breath sounds normal. No respiratory distress. She has no wheezes. She has no rales.  Breast: deferred   Abdominal: Soft. She exhibits no distension. There is no tenderness.  Lymphadenopathy: She has no cervical adenopathy.  Skin: Skin is warm and dry. She is not diaphoretic.  Psychiatric: She has a normal mood and affect. Her behavior is normal.     Lab Results  Component Value Date  WBC 4.0 12/02/2023   HGB 13.2 12/02/2023   HCT 39.9 12/02/2023   PLT 187.0 12/02/2023   GLUCOSE 88 12/02/2023   CHOL 169 12/02/2023   TRIG 151.0 (H) 12/02/2023   HDL 67.10 12/02/2023   LDLDIRECT 66.0 04/07/2016   LDLCALC 72 12/02/2023   ALT 24 12/02/2023   AST 26 12/02/2023   NA 141 12/02/2023   K 3.8 12/02/2023   CL 104 12/02/2023   CREATININE 0.89 12/02/2023   BUN 11 12/02/2023   CO2 28 12/02/2023   TSH 2.25 12/02/2023   HGBA1C 4.8 05/03/2019         Assessment & Plan:   Physical exam: Screening blood work  ordered Exercise   Weight   Substance abuse  none   Reviewed recommended immunizations.   Health Maintenance  Topic Date Due  . COVID-19 Vaccine (3 - 2024-25 season)  12/18/2023 (Originally 04/12/2023)  . INFLUENZA VACCINE  03/11/2024  . MAMMOGRAM  12/01/2024  . DTaP/Tdap/Td (3 - Td or Tdap) 05/02/2029  . Colonoscopy  05/29/2029  . Hepatitis C Screening  Completed  . HIV Screening  Completed  . Zoster Vaccines- Shingrix  Completed  . HPV VACCINES  Aged Out  . Meningococcal B Vaccine  Aged Out          See Problem List for Assessment and Plan of chronic medical problems.     This encounter was created in error - please disregard.

## 2023-10-26 NOTE — Patient Instructions (Addendum)

## 2023-10-27 ENCOUNTER — Encounter: Payer: Commercial Managed Care - PPO | Admitting: Internal Medicine

## 2023-10-27 DIAGNOSIS — R41840 Attention and concentration deficit: Secondary | ICD-10-CM

## 2023-10-27 DIAGNOSIS — M5416 Radiculopathy, lumbar region: Secondary | ICD-10-CM

## 2023-10-27 DIAGNOSIS — G43009 Migraine without aura, not intractable, without status migrainosus: Secondary | ICD-10-CM

## 2023-10-27 DIAGNOSIS — M5412 Radiculopathy, cervical region: Secondary | ICD-10-CM

## 2023-10-27 DIAGNOSIS — F411 Generalized anxiety disorder: Secondary | ICD-10-CM

## 2023-10-27 DIAGNOSIS — Z0000 Encounter for general adult medical examination without abnormal findings: Secondary | ICD-10-CM

## 2023-10-27 NOTE — Assessment & Plan Note (Signed)
Chronic Has migraines w and wo aura Migraines 1-2 times a month Continue propranolol 2 tabs every 8-12 hours as needed for migraine Propranolol has not worked as well recently Can use excedrin migraine prn - not to take more than 2/week Trial of maxalt 5 mg  - ? Side effect to this in the past - willing to retry - if she has a side effect will try nurtec or ubrelvy

## 2023-10-27 NOTE — Assessment & Plan Note (Signed)
 New diagnosis On strattera Managed by psych

## 2023-10-27 NOTE — Assessment & Plan Note (Signed)
Chronic Following with the pain clinic

## 2023-10-27 NOTE — Assessment & Plan Note (Signed)
 Chronic Management per psychiatry On bupropion, buspirone, trazodone Check CMP, TSH

## 2023-10-27 NOTE — Assessment & Plan Note (Signed)
Chronic Following with pain management On hydrocodone-acetaminophen, gabapentin

## 2023-12-01 ENCOUNTER — Encounter: Payer: Self-pay | Admitting: Internal Medicine

## 2023-12-01 NOTE — Progress Notes (Unsigned)
 Subjective:    Patient ID: Nicole Dunn, female    DOB: 1966/02/09, 58 y.o.   MRN: [id number]      HPI Nicole Dunn is here for a Physical exam and her chronic medical problems.    Injured her shoulder at work - waiting for PT by workers comp.   Medications and allergies reviewed with patient and updated if appropriate.  Current Outpatient Medications on File Prior to Visit  Medication Sig Dispense Refill   atomoxetine (STRATTERA) 18 MG capsule Take 18 mg by mouth every morning.     baclofen (LIORESAL) 10 MG tablet Take 10 mg by mouth 2 (two) times daily.     buPROPion  (WELLBUTRIN  XL) 300 MG 24 hr tablet Take 1 tablet (300 mg total) by mouth daily. Must transition care to new provider for refills 90 tablet 0   Cholecalciferol (VITAMIN D3) 5000 units CAPS Take 1 capsule by mouth daily.     diclofenac  (FLECTOR) 1.3 % PTCH APPLY 1 PATCH TO EACH FOOT TWICE PER DAY FOR ACUTE PAIN WITH SEVERE INFLAMMATION AND SPASM     estradiol (ESTRACE) 1 MG tablet Take 1 mg by mouth daily.     gabapentin  (NEURONTIN ) 300 MG capsule 1 capsule Orally Four times a day for 90 days     HYDROcodone -acetaminophen  (NORCO) 10-325 MG tablet SMARTSIG:1 Tablet(s) By Mouth 4-5 Times Daily     Magnesium 400 MG CAPS Take 400 mg by mouth.     metroNIDAZOLE (METROCREAM) 0.75 % cream Apply 1 application topically 2 (two) times daily.     propranolol  (INDERAL ) 10 MG tablet TAKE 2 TABLETS EVERY 8 TO 12 HOURS AS NEEDED. 270 tablet 1   traZODone (DESYREL) 50 MG tablet Take 50 mg by mouth at bedtime.     valACYclovir (VALTREX) 500 MG tablet Take 500 mg by mouth daily.     zolmitriptan  (ZOMIG ) 5 MG tablet TAKE 1 TABLET BY MOUTH DAILY AS NEEDED FOR MIGRAINE. MAY REPEAT X 1 DOSE AFTER 2 HOURS AS NEEDED. MAX 2 TABS/DAY 9 tablet 1   Current Facility-Administered Medications on File Prior to Visit  Medication Dose Route Frequency Provider Last Rate Last Admin   triamcinolone  acetonide (KENALOG ) 10 MG/ML injection 10 mg  10 mg  Intra-articular Once         Review of Systems  Constitutional:  Negative for fever.  Eyes:  Negative for visual disturbance.  Respiratory:  Negative for cough, shortness of breath and wheezing.   Cardiovascular:  Positive for leg swelling (when on feet all day). Negative for chest pain and palpitations.  Gastrointestinal:  Positive for constipation. Negative for abdominal pain, blood in stool and diarrhea.       Occ gerd  Genitourinary:  Negative for dysuria.  Musculoskeletal:  Positive for back pain (chronic). Negative for arthralgias.  Skin:  Negative for rash.  Neurological:  Positive for headaches (frequent - nothing new, migraines). Negative for dizziness and light-headedness.  Psychiatric/Behavioral:  Positive for dysphoric mood and sleep disturbance. The patient is nervous/anxious.        Objective:   Vitals:   12/02/23 0851  BP: 120/80  Pulse: 92  Temp: 98.1 F (36.7 C)  SpO2: 99%   Filed Weights   12/02/23 0851  Weight: 144 lb (65.3 kg)   Body mass index is 25.51 kg/m.  BP Readings from Last 3 Encounters:  12/02/23 120/80  09/16/22 124/70  07/27/21 (!) 147/91    Wt Readings from Last 3 Encounters:  12/02/23 144 lb (  65.3 kg)  09/16/22 141 lb (64 kg)  07/27/21 144 lb 3.2 oz (65.4 kg)       Physical Exam Constitutional: She appears well-developed and well-nourished. No distress.  HENT:  Head: Normocephalic and atraumatic.  Right Ear: External ear normal. Normal ear canal and TM Left Ear: External ear normal.  Normal ear canal and TM Mouth/Throat: Oropharynx is clear and moist.  Eyes: Conjunctivae normal.  Neck: Neck supple. No tracheal deviation present. No thyromegaly present.  No carotid bruit  Cardiovascular: Normal rate, regular rhythm and normal heart sounds.   2/6 sys murmur heard.  No edema. Pulmonary/Chest: Effort normal and breath sounds normal. No respiratory distress. She has no wheezes. She has no rales.  Breast: deferred   Abdominal:  Soft. She exhibits no distension. There is no tenderness.  Lymphadenopathy: She has no cervical adenopathy.  Skin: Skin is warm and dry. She is not diaphoretic.  Psychiatric: She has a normal mood and affect. Her behavior is normal.     Lab Results  Component Value Date   WBC 4.2 09/16/2022   HGB 12.4 09/16/2022   HCT 36.8 09/16/2022   PLT 178.0 09/16/2022   GLUCOSE 87 09/16/2022   CHOL 145 09/16/2022   TRIG 111.0 09/16/2022   HDL 58.60 09/16/2022   LDLDIRECT 66.0 04/07/2016   LDLCALC 64 09/16/2022   ALT 41 (H) 09/16/2022   AST 37 09/16/2022   NA 141 09/16/2022   K 4.0 09/16/2022   CL 103 09/16/2022   CREATININE 0.96 09/16/2022   BUN 16 09/16/2022   CO2 26 09/16/2022   TSH 3.35 09/16/2022   HGBA1C 4.8 05/03/2019         Assessment & Plan:   Physical exam: Screening blood work  ordered Exercise   walking dogs Weight  normal Substance abuse  none   Reviewed recommended immunizations.   Health Maintenance  Topic Date Due   MAMMOGRAM  05/30/2021   COVID-19 Vaccine (3 - 2024-25 season) 12/18/2023 (Originally 04/12/2023)   INFLUENZA VACCINE  03/11/2024   DTaP/Tdap/Td (3 - Td or Tdap) 05/02/2029   Colonoscopy  05/29/2029   Hepatitis C Screening  Completed   HIV Screening  Completed   Zoster Vaccines- Shingrix  Completed   HPV VACCINES  Aged Out   Meningococcal B Vaccine  Aged Out     Will get mammo report     See Problem List for Assessment and Plan of chronic medical problems.

## 2023-12-01 NOTE — Patient Instructions (Addendum)

## 2023-12-02 ENCOUNTER — Ambulatory Visit (INDEPENDENT_AMBULATORY_CARE_PROVIDER_SITE_OTHER): Admitting: Internal Medicine

## 2023-12-02 ENCOUNTER — Encounter: Payer: Self-pay | Admitting: Internal Medicine

## 2023-12-02 VITALS — BP 120/80 | HR 92 | Temp 98.1°F | Ht 63.0 in | Wt 144.0 lb

## 2023-12-02 DIAGNOSIS — E781 Pure hyperglyceridemia: Secondary | ICD-10-CM

## 2023-12-02 DIAGNOSIS — G43009 Migraine without aura, not intractable, without status migrainosus: Secondary | ICD-10-CM

## 2023-12-02 DIAGNOSIS — R41840 Attention and concentration deficit: Secondary | ICD-10-CM

## 2023-12-02 DIAGNOSIS — M545 Low back pain, unspecified: Secondary | ICD-10-CM

## 2023-12-02 DIAGNOSIS — Z0000 Encounter for general adult medical examination without abnormal findings: Secondary | ICD-10-CM | POA: Diagnosis not present

## 2023-12-02 DIAGNOSIS — F411 Generalized anxiety disorder: Secondary | ICD-10-CM | POA: Diagnosis not present

## 2023-12-02 LAB — LIPID PANEL
Cholesterol: 169 mg/dL (ref 0–200)
HDL: 67.1 mg/dL (ref >39.00)
LDL Cholesterol: 72 mg/dL (ref 0–99)
NonHDL: 101.87
Total CHOL/HDL Ratio: 3
Triglycerides: 151 mg/dL — ABNORMAL HIGH (ref 0.0–149.0)
VLDL: 30.2 mg/dL (ref 0.0–40.0)

## 2023-12-02 LAB — CBC WITH DIFFERENTIAL/PLATELET
Basophils Absolute: 0 10*3/uL (ref 0.0–0.1)
Basophils Relative: 0.6 % (ref 0.0–3.0)
Eosinophils Absolute: 0.1 10*3/uL (ref 0.0–0.7)
Eosinophils Relative: 1.9 % (ref 0.0–5.0)
HCT: 39.9 % (ref 36.0–46.0)
Hemoglobin: 13.2 g/dL (ref 12.0–15.0)
Lymphocytes Relative: 22.9 % (ref 12.0–46.0)
Lymphs Abs: 0.9 10*3/uL (ref 0.7–4.0)
MCHC: 33 g/dL (ref 30.0–36.0)
MCV: 96.9 fl (ref 78.0–100.0)
Monocytes Absolute: 0.3 10*3/uL (ref 0.1–1.0)
Monocytes Relative: 6.2 % (ref 3.0–12.0)
Neutro Abs: 2.7 10*3/uL (ref 1.4–7.7)
Neutrophils Relative %: 68.4 % (ref 43.0–77.0)
Platelets: 187 10*3/uL (ref 150.0–400.0)
RBC: 4.12 Mil/uL (ref 3.87–5.11)
RDW: 12.4 % (ref 11.5–15.5)
WBC: 4 10*3/uL (ref 4.0–10.5)

## 2023-12-02 LAB — COMPREHENSIVE METABOLIC PANEL WITH GFR
ALT: 24 U/L (ref 0–35)
AST: 26 U/L (ref 0–37)
Albumin: 4.5 g/dL (ref 3.5–5.2)
Alkaline Phosphatase: 50 U/L (ref 39–117)
BUN: 11 mg/dL (ref 6–23)
CO2: 28 meq/L (ref 19–32)
Calcium: 9.4 mg/dL (ref 8.4–10.5)
Chloride: 104 meq/L (ref 96–112)
Creatinine, Ser: 0.89 mg/dL (ref 0.40–1.20)
GFR: 71.94 mL/min (ref >60.00)
Glucose, Bld: 88 mg/dL (ref 70–99)
Potassium: 3.8 meq/L (ref 3.5–5.1)
Sodium: 141 meq/L (ref 135–145)
Total Bilirubin: 0.5 mg/dL (ref 0.2–1.2)
Total Protein: 7.2 g/dL (ref 6.0–8.3)

## 2023-12-02 LAB — HM MAMMOGRAPHY

## 2023-12-02 LAB — TSH: TSH: 2.25 u[IU]/mL (ref 0.35–5.50)

## 2023-12-02 MED ORDER — ZOLMITRIPTAN 5 MG PO TABS: 5.0000 mg | ORAL_TABLET | ORAL | 11 refills | Status: DC | PRN

## 2023-12-02 NOTE — Assessment & Plan Note (Addendum)
 Chronic Management per psychiatry On bupropion , trazodone Check CMP, TSH

## 2023-12-02 NOTE — Assessment & Plan Note (Addendum)
 Chronic Has migraines w and wo aura Migraines 1-2 times a month -comes in clusters Continue propranolol  2 tabs Q 8-12 hours as needed for migraines Propranolol  has not worked as well recently Can use excedrin migraine prn - not to take more than 2/week Zomig  5 mg daily as needed for migraine

## 2023-12-02 NOTE — Assessment & Plan Note (Signed)
 Chronic Regular exercise and healthy diet encouraged Check lipid panel, CMP, TSH, CBC Continue lifestyle control

## 2023-12-02 NOTE — Assessment & Plan Note (Signed)
 Following with pain management  Taking norco and gabapentin  Pain tolerable

## 2023-12-02 NOTE — Assessment & Plan Note (Signed)
 Chronic On strattera 18 mg daily Managed by psychiatry

## 2023-12-09 ENCOUNTER — Encounter: Payer: Self-pay | Admitting: Internal Medicine

## 2023-12-09 MED ORDER — RIZATRIPTAN BENZOATE 10 MG PO TABS: 10.0000 mg | ORAL_TABLET | ORAL | 8 refills | Status: DC | PRN

## 2024-04-25 ENCOUNTER — Encounter: Payer: Self-pay | Admitting: Internal Medicine

## 2024-04-25 ENCOUNTER — Other Ambulatory Visit: Payer: Self-pay

## 2024-04-25 MED ORDER — COVID-19 MRNA VAC-TRIS(PFIZER) 30 MCG/0.3ML IM SUSY
0.3000 mL | PREFILLED_SYRINGE | Freq: Once | INTRAMUSCULAR | 0 refills | Status: AC
Start: 2024-04-25 — End: 2024-04-25

## 2024-09-04 ENCOUNTER — Other Ambulatory Visit: Payer: Self-pay | Admitting: Internal Medicine
